# Patient Record
Sex: Female | Born: 1965 | Race: White | Hispanic: No | Marital: Single | Smoking: Former smoker
Health system: Southern US, Community
[De-identification: ages and names within clinical notes are randomized; demographics above are authoritative.]

## PROBLEM LIST (undated history)

## (undated) DIAGNOSIS — K219 Gastro-esophageal reflux disease without esophagitis: Secondary | ICD-10-CM

## (undated) DIAGNOSIS — R002 Palpitations: Secondary | ICD-10-CM

## (undated) DIAGNOSIS — J329 Chronic sinusitis, unspecified: Secondary | ICD-10-CM

## (undated) DIAGNOSIS — E785 Hyperlipidemia, unspecified: Secondary | ICD-10-CM

## (undated) DIAGNOSIS — U071 COVID-19: Secondary | ICD-10-CM

## (undated) HISTORY — DX: Chronic sinusitis, unspecified: J32.9

## (undated) HISTORY — DX: Hyperlipidemia, unspecified: E78.5

## (undated) HISTORY — PX: INDUCED ABORTION: SHX677

## (undated) HISTORY — DX: Palpitations: R00.2

## (undated) HISTORY — DX: Gastro-esophageal reflux disease without esophagitis: K21.9

## (undated) HISTORY — DX: COVID-19: U07.1

---

## 2002-07-14 ENCOUNTER — Other Ambulatory Visit: Admission: RE | Admit: 2002-07-14 | Discharge: 2002-07-14 | Payer: Self-pay | Admitting: Obstetrics & Gynecology

## 2003-04-22 ENCOUNTER — Encounter: Payer: Self-pay | Admitting: Obstetrics & Gynecology

## 2003-04-22 ENCOUNTER — Inpatient Hospital Stay (HOSPITAL_COMMUNITY): Admission: AD | Admit: 2003-04-22 | Discharge: 2003-04-22 | Payer: Self-pay | Admitting: Obstetrics & Gynecology

## 2003-10-08 ENCOUNTER — Inpatient Hospital Stay (HOSPITAL_COMMUNITY): Admission: AD | Admit: 2003-10-08 | Discharge: 2003-10-08 | Payer: Self-pay | Admitting: Obstetrics and Gynecology

## 2003-10-09 ENCOUNTER — Inpatient Hospital Stay (HOSPITAL_COMMUNITY): Admission: AD | Admit: 2003-10-09 | Discharge: 2003-10-09 | Payer: Self-pay | Admitting: Obstetrics and Gynecology

## 2003-10-10 ENCOUNTER — Inpatient Hospital Stay (HOSPITAL_COMMUNITY): Admission: AD | Admit: 2003-10-10 | Discharge: 2003-10-13 | Payer: Self-pay | Admitting: Obstetrics & Gynecology

## 2003-10-17 ENCOUNTER — Encounter: Admission: RE | Admit: 2003-10-17 | Discharge: 2003-11-16 | Payer: Self-pay | Admitting: Obstetrics & Gynecology

## 2003-11-08 ENCOUNTER — Other Ambulatory Visit: Admission: RE | Admit: 2003-11-08 | Discharge: 2003-11-08 | Payer: Self-pay | Admitting: Obstetrics & Gynecology

## 2004-12-23 ENCOUNTER — Other Ambulatory Visit: Admission: RE | Admit: 2004-12-23 | Discharge: 2004-12-23 | Payer: Self-pay | Admitting: Obstetrics and Gynecology

## 2006-01-12 ENCOUNTER — Other Ambulatory Visit: Admission: RE | Admit: 2006-01-12 | Discharge: 2006-01-12 | Payer: Self-pay | Admitting: Obstetrics & Gynecology

## 2009-09-18 ENCOUNTER — Emergency Department (HOSPITAL_COMMUNITY): Admission: EM | Admit: 2009-09-18 | Discharge: 2009-09-18 | Payer: Self-pay | Admitting: Emergency Medicine
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
## 2011-01-14 ENCOUNTER — Other Ambulatory Visit: Payer: Self-pay

## 2011-04-25 NOTE — Op Note (Signed)
NAME:  Tracy Bishop, Tracy Bishop                          ACCOUNT NO.:  [id number]   MEDICAL RECORD NO.:  [id number]                   PATIENT TYPE:  INP   LOCATION:  9102                                 FACILITY:  [facility]   PHYSICIAN:  Ilda Mori, M.D.                DATE OF BIRTH:  1966/03/21   DATE OF PROCEDURE:  10/11/2003  DATE OF DISCHARGE:                                 OPERATIVE REPORT   PREOPERATIVE DIAGNOSIS:  Non-reassuring fetal heart rate tracing; term  pregnancy in active labor.   POSTOPERATIVE DIAGNOSES:  Non-reassuring fetal heart rate tracing; term  pregnancy in active labor.   PROCEDURE:  Primary low transverse cesarean section.   SURGEON:  Ilda Mori, M.D.   ANESTHESIA:  Epidural.   ESTIMATED BLOOD LOSS:  500 cc.   FINDINGS:  Female infant, Apgar scores 8 and 9.  Birth weight 7 pounds 5  ounces.  Meconium-stained amniotic fluid.  Thin, single nuchal cord.  Normal  tubes and ovaries.  Arterial cord pH pending at the time of this dictation.   INDICATIONS:  This is 45 year old gravida 2, para 0, AB 1 who is admitted in  the morning of October 10, 2003 with dysfunctional latent phase labor.  Artificial rupture of membranes was performed, with clear fluid.  Cervix at  that point was 1 and 90% with the vertex at the 0 minus 1 station.  IV  Pitocin was begun and the patient had a prolonged latent phase, but finally  entered the active phase of labor at approximately midnight on October 10, 2003.  The patient began to experience moderate variables, with good  recovery and good variability of the baseline.  The impression was  intermittent cord compression, but no evidence of acidosis or hypoxia, and  the trial of labor was continued.  This observation was made a 1:15 a.m. on  the morning of October 11, 2003.  At 2:25 a.m. the cervix had progressed to  8 cm; however, the patient at that point was having persistent periodic  decelerations, which were late in onset and  occurring with every  contraction.  In view of no assurance of delivery within the next 60 mins,  the decision was made to proceed with cesarean section due to this  nonreassuring fetal heart rate tracing.   DESCRIPTION OF PROCEDURE:  The patient was brought to the operating room,  where adequate epidural anesthesia was obtained.  The abdomen was prepped  and draped in sterile fashion.  A low transverse incision was made and  carried down into the fascia.  The incision was extended transversely into  fascia.  The anterior rectus sheath was then dissected free and the  peritoneum was entered sharply and extended bluntly.  The lower uterine  segment was identified and incision was made in the serosa.  The bladder was  displaced inferiorly.  The incision was carried down to the  amniotic cavity,  where thick meconium was noted.  The incision was extended bluntly.  The  infant was delivered without difficulty.  There appeared to be no meconium  in the nasal, oropharynx or the level of the cord.  The infant was given to  the pediatrician.  A single nuchal cord was noted at that time, and the cord  was noted to be quite thin.  The cord bloods were obtained, including  arterial cord pH (which is pending at the time of this dictation).  The  placenta was delivered by manual extraction and the uterus was bluntly  curettaged.  The lower segment was closed with a single layer of running,  interlocking Vicryl suture.  The bladder plane was not reopposed.  The rectus muscle and peritoneum were closed with single running 3-0 Vicryl  suture.  The fascia was closed with running 0 Vicryl suture.  The skin was  closed with staples.  The patient tolerated the procedure well and left the  operating room in good condition.                                               Ilda Mori, M.D.    RK/MEDQ  D:  10/11/2003  T:  10/11/2003  Job:  [id number]

## 2011-04-25 NOTE — Discharge Summary (Signed)
NAME:  Tracy Bishop, Tracy Bishop                          ACCOUNT NO.:  [id number]   MEDICAL RECORD NO.:  [id number]                   PATIENT TYPE:  INP   LOCATION:  9102                                 FACILITY:  [facility]   PHYSICIAN:  Randye Lobo, M.D.                DATE OF BIRTH:  02/28/1966   DATE OF ADMISSION:  10/10/2003  DATE OF DISCHARGE:  10/13/2003                                 DISCHARGE SUMMARY   FINAL DIAGNOSES:  1. Intrauterine pregnancy at term.  2. Nonreassuring fetal heart rate tracing.  3. Term pregnancy in active labor.   PROCEDURE:  Primary low transverse cesarean section.   SURGEON:  Ilda Mori, M.D.   COMPLICATIONS:  None.   HISTORY:  This 45 year old G2, P0-0-1-0 was admitted on November 2 with  dysfunctional latent phase of labor.  The patient's prenatal course had been  complicated by advanced maternal age.  She did decline amniocentesis and a  quad screen.  The patient was also a smoker and unable to quit during her  pregnancy.  She did have a positive group B Strep culture performed in the  office.  At this point she was admitted for AROM and IV Pitocin.  The  patient's cervix on admission was 3+ cm dilated, 90% effaced, -1 station.  Around midnight on November 2 patient finally entered the active phase of  labor.  At this point some moderate variables were experienced, but with  recovering good variability.  The trial of labor was continued.  At 1:15 in  the morning Ilda Mori, M.D. got a call when the patient was about 8 cm  dilated.  At this point she was having persistent decelerations which were  late in onset and occurring with every contraction.  In view of this  nonreassuring fetal heart tracing the patient was taken to the operating  room for a cesarean section.  She was taken to the operating room on  October 11, 2003 by Ilda Mori, M.D. where a primary low transverse  cesarean section was performed with the delivery of a 7 pound 5 ounce  female  infant with Apgars of 8 and 9.  There was a thin single nuchal cord noted.  The delivery went without complication.  The patient's postoperative course  was benign without significant fevers.  She was felt ready for discharge on  postoperative day #2.  Was sent home on a regular diet.  Told to decrease  activities.  Told to continue prenatal vitamins.  Was given a prescription  for Percocet one to two q.4h. as needed for pain.  Told she could use  ibuprofen q.6h. as needed for pain.  Was to follow up in the office in four  weeks.   DISCHARGE LABORATORIES:  The patient had a hemoglobin of 12.4 and a white  blood cell count of 15.9.     Leilani Able, P.A.-C.  Randye Lobo, M.D.    MB/MEDQ  D:  11/13/2003  T:  11/13/2003  Job:  [id number]

## 2016-01-24 ENCOUNTER — Ambulatory Visit
Admission: RE | Admit: 2016-01-24 | Discharge: 2016-01-24 | Disposition: A | Discharge status: Home / Self Care | Payer: BLUE CROSS/BLUE SHIELD | Source: Ambulatory Visit | Attending: *Deleted | Admitting: *Deleted

## 2016-01-24 ENCOUNTER — Other Ambulatory Visit: Payer: Self-pay | Admitting: *Deleted

## 2016-01-24 DIAGNOSIS — M542 Cervicalgia: Secondary | ICD-10-CM

## 2016-03-27 DIAGNOSIS — M47812 Spondylosis without myelopathy or radiculopathy, cervical region: Secondary | ICD-10-CM | POA: Insufficient documentation

## 2017-01-29 IMAGING — CR DG CERVICAL SPINE 2 OR 3 VIEWS
2 series · 2 of 2 positions shown · non-contrast
Comparison: None.

CLINICAL DATA: Chronic cervicalgia

EXAM:
CERVICAL SPINE - 2-3 VIEW

[w cervical spine lat]
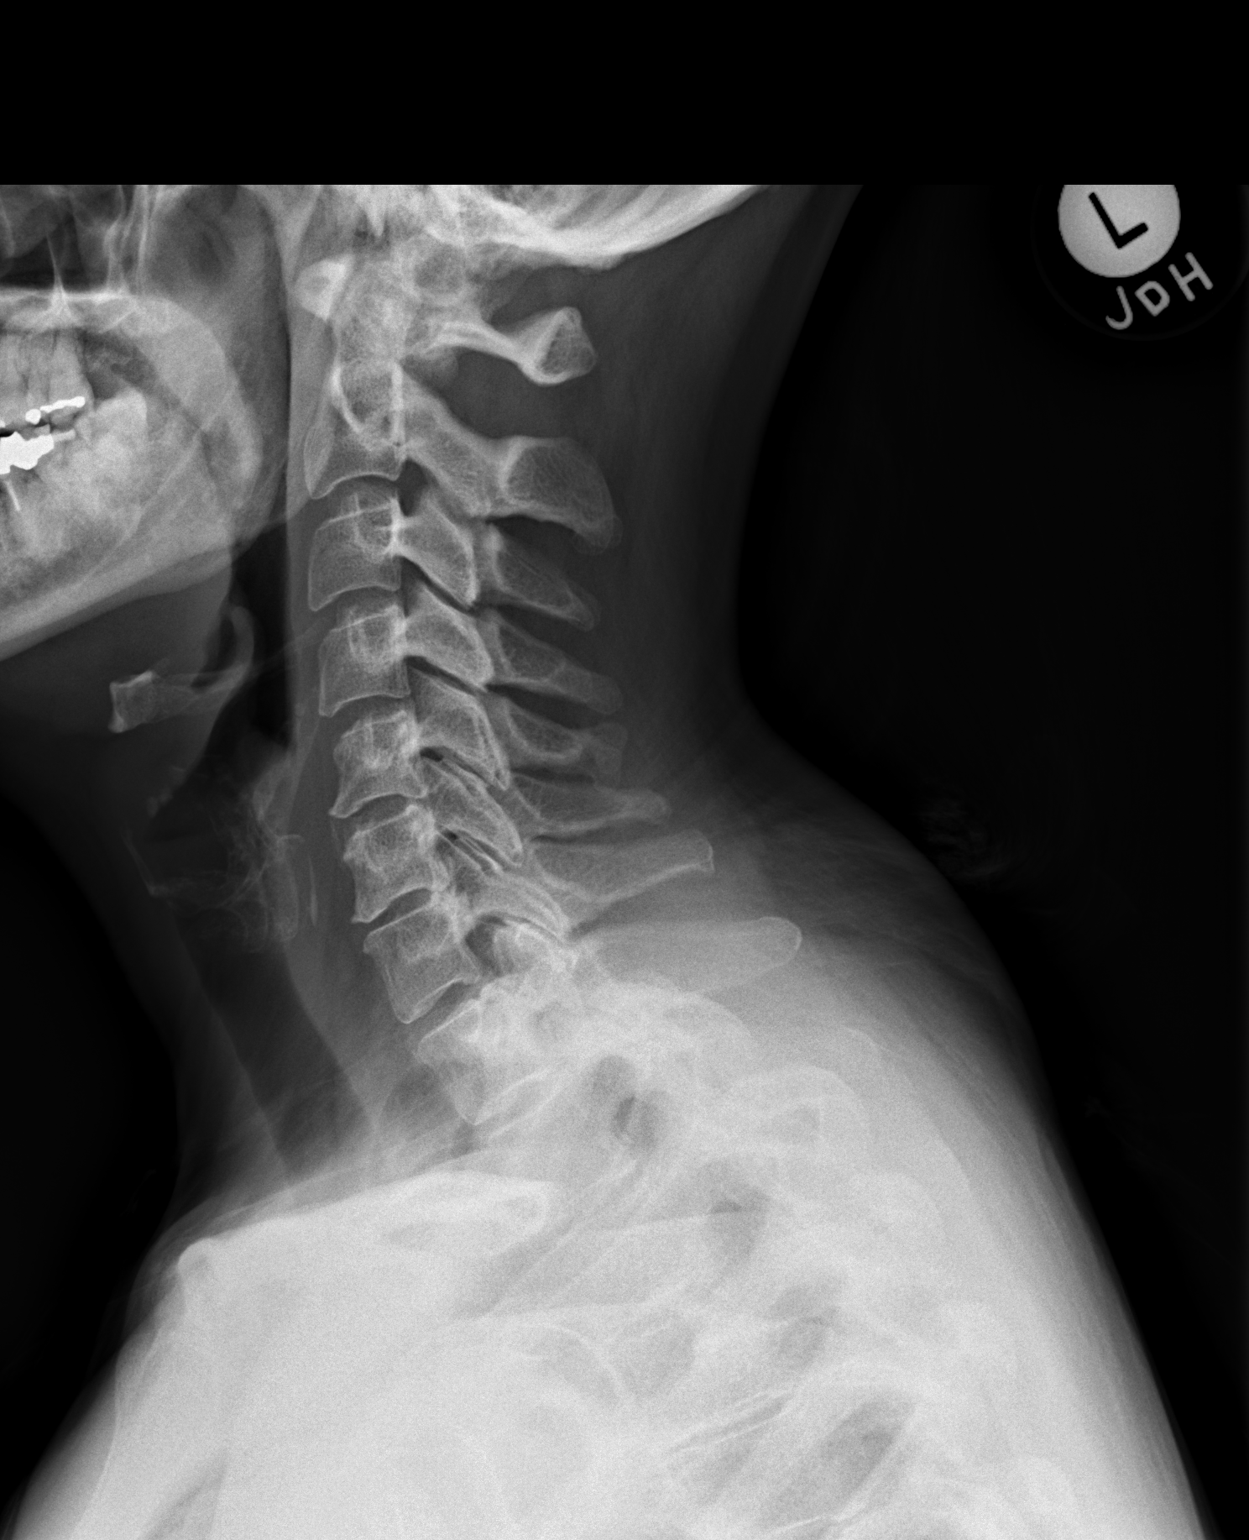

[w cervical spine ap]
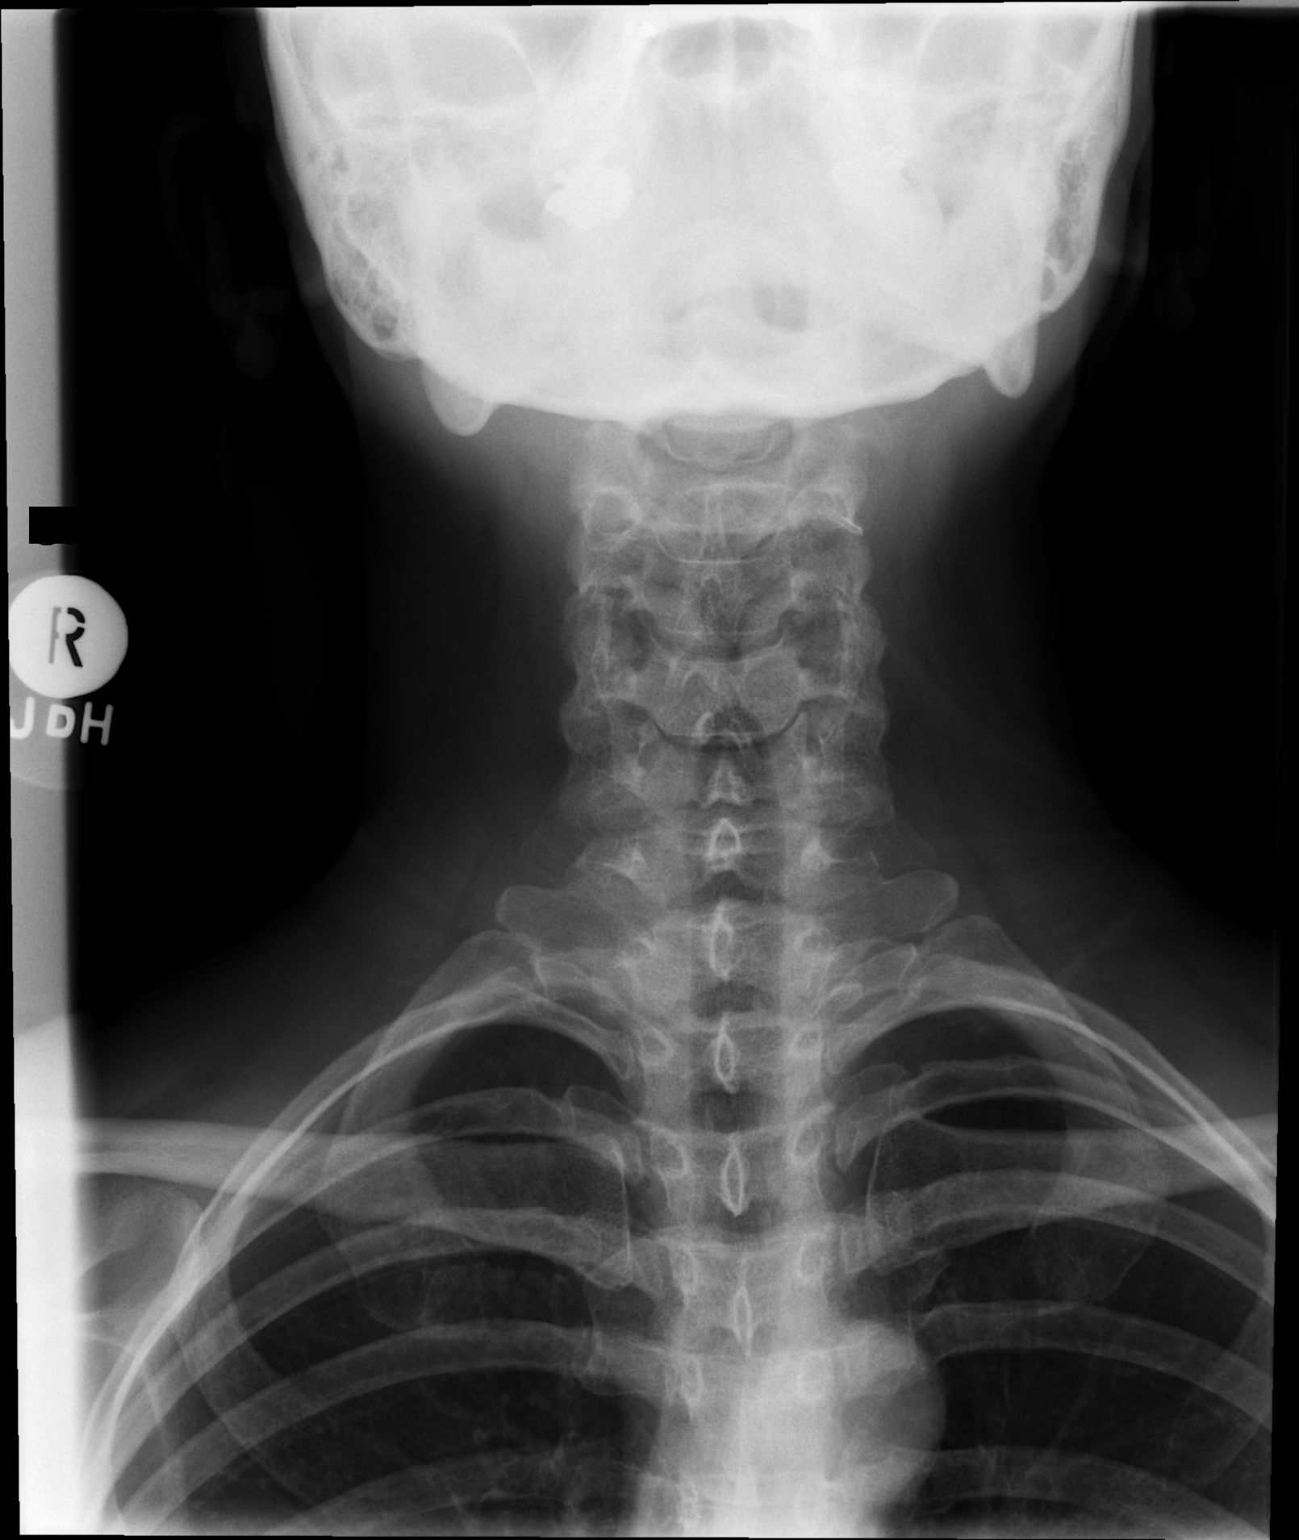

[2 of 2 positions shown; findings below may reference images not displayed]

FINDINGS: Upright frontal and upright lateral views obtained. There is no
fracture or spondylolisthesis. Prevertebral soft tissues and
predental space regions are normal. There is moderate disc space
narrowing at C6-7. There is slightly milder narrowing of the disc at
C5-6. There are anterior osteophytes at C5 and C6. No erosive
change.
IMPRESSION: Osteoarthritic change at C5-6 and C6-7. No fracture or
spondylolisthesis.

## 2018-05-27 ENCOUNTER — Ambulatory Visit: Payer: Self-pay | Admitting: Allergy

## 2019-02-08 ENCOUNTER — Encounter: Payer: Self-pay | Admitting: Gastroenterology

## 2019-02-22 ENCOUNTER — Telehealth: Payer: Self-pay | Admitting: *Deleted

## 2019-02-22 NOTE — Telephone Encounter (Signed)
Covid-19 travel screening questions  Have you traveled in the last 14 days? no If yes where?  Do you now or have you had a fever in the last 14 days? no  Do you have any respiratory symptoms of shortness of breath or cough now or in the last 14 days? No new cough- pt has dry, non productive cough- pt states she has allergies and has this cough every allergy season. No new symptoms  Do you have a medical history of Congestive Heart Failure? no  Do you have a medical history of lung disease? no  Do you have any family members or close contacts with diagnosed or suspected Covid-19? no

## 2019-02-23 ENCOUNTER — Other Ambulatory Visit: Payer: Self-pay

## 2019-02-23 ENCOUNTER — Telehealth: Payer: Self-pay | Admitting: *Deleted

## 2019-02-23 NOTE — Telephone Encounter (Signed)
Patient needs cardiac clearance for Procedure. Patient cancelled cardiology appointment earlier this week and had rescheduled with [facility] on 03/22/2019.Dr Chales Abrahams business card given and will call back once work up is complete.

## 2019-03-09 ENCOUNTER — Encounter: Payer: BLUE CROSS/BLUE SHIELD | Admitting: Gastroenterology

## 2021-03-26 ENCOUNTER — Other Ambulatory Visit (HOSPITAL_COMMUNITY): Payer: Self-pay

## 2021-03-27 ENCOUNTER — Other Ambulatory Visit (HOSPITAL_COMMUNITY): Payer: Self-pay

## 2021-03-27 MED ORDER — CLIMARA PRO 0.045-0.015 MG/DAY TD PTWK
MEDICATED_PATCH | TRANSDERMAL | 11 refills | Status: DC
  Filled 2021-03-27 (×2): qty 4, 28d supply, fill #0

## 2021-03-27 MED ORDER — CLIMARA PRO 0.045-0.015 MG/DAY TD PTWK
MEDICATED_PATCH | TRANSDERMAL | 11 refills | Status: DC
  Filled 2021-03-27: qty 4, 28d supply, fill #0

## 2021-03-28 ENCOUNTER — Other Ambulatory Visit (HOSPITAL_COMMUNITY): Payer: Self-pay

## 2021-03-28 MED ORDER — PROGESTERONE MICRONIZED 100 MG PO CAPS
ORAL_CAPSULE | ORAL | 11 refills | Status: DC
  Filled 2021-03-28: qty 30, 30d supply, fill #0
  Filled 2021-05-08: qty 30, 30d supply, fill #1
  Filled 2021-06-12: qty 30, 30d supply, fill #2
  Filled 2021-07-08: qty 30, 30d supply, fill #3
  Filled 2021-08-07: qty 30, 30d supply, fill #4
  Filled 2021-09-02: qty 30, 30d supply, fill #5
  Filled 2021-10-29: qty 30, 30d supply, fill #6
  Filled 2021-12-03: qty 30, 30d supply, fill #7
  Filled 2022-02-03: qty 30, 30d supply, fill #8
  Filled 2022-03-18: qty 30, 30d supply, fill #9
  Filled 2022-03-25: qty 30, 30d supply, fill #10

## 2021-03-28 MED ORDER — ESTRADIOL 0.05 MG/24HR TD PTWK
MEDICATED_PATCH | TRANSDERMAL | 11 refills | Status: DC
  Filled 2021-03-28 (×3): qty 4, 28d supply, fill #0
  Filled 2021-11-05: qty 4, 28d supply, fill #1

## 2021-03-29 ENCOUNTER — Other Ambulatory Visit (HOSPITAL_COMMUNITY): Payer: Self-pay

## 2021-04-04 ENCOUNTER — Other Ambulatory Visit (HOSPITAL_COMMUNITY): Payer: Self-pay

## 2021-04-04 MED ORDER — ESTRADIOL 0.05 MG/24HR TD PTTW
MEDICATED_PATCH | TRANSDERMAL | 11 refills | Status: DC
  Filled 2021-04-04 – 2021-04-16 (×2): qty 8, 28d supply, fill #0
  Filled 2021-05-09: qty 8, 28d supply, fill #1
  Filled 2021-06-12: qty 8, 28d supply, fill #2
  Filled 2021-07-08: qty 8, 28d supply, fill #3
  Filled 2021-08-07: qty 8, 28d supply, fill #4
  Filled 2021-09-02: qty 8, 28d supply, fill #5
  Filled 2021-10-07: qty 8, 28d supply, fill #6
  Filled 2021-11-08: qty 8, 28d supply, fill #7
  Filled 2021-11-28: qty 8, 28d supply, fill #8

## 2021-04-11 ENCOUNTER — Other Ambulatory Visit (HOSPITAL_COMMUNITY): Payer: Self-pay

## 2021-04-16 ENCOUNTER — Other Ambulatory Visit (HOSPITAL_COMMUNITY): Payer: Self-pay

## 2021-05-08 ENCOUNTER — Other Ambulatory Visit (HOSPITAL_COMMUNITY): Payer: Self-pay

## 2021-05-09 ENCOUNTER — Other Ambulatory Visit (HOSPITAL_COMMUNITY): Payer: Self-pay

## 2021-06-12 ENCOUNTER — Other Ambulatory Visit (HOSPITAL_COMMUNITY): Payer: Self-pay

## 2021-07-08 ENCOUNTER — Other Ambulatory Visit (HOSPITAL_COMMUNITY): Payer: Self-pay

## 2021-07-10 ENCOUNTER — Other Ambulatory Visit (HOSPITAL_COMMUNITY): Payer: Self-pay

## 2021-08-07 ENCOUNTER — Other Ambulatory Visit (HOSPITAL_COMMUNITY): Payer: Self-pay

## 2021-08-09 ENCOUNTER — Other Ambulatory Visit (HOSPITAL_COMMUNITY): Payer: Self-pay

## 2021-09-02 ENCOUNTER — Other Ambulatory Visit (HOSPITAL_COMMUNITY): Payer: Self-pay

## 2021-10-07 ENCOUNTER — Other Ambulatory Visit (HOSPITAL_COMMUNITY): Payer: Self-pay

## 2021-10-29 ENCOUNTER — Other Ambulatory Visit (HOSPITAL_COMMUNITY): Payer: Self-pay

## 2021-11-05 ENCOUNTER — Other Ambulatory Visit (HOSPITAL_COMMUNITY): Payer: Self-pay

## 2021-11-06 ENCOUNTER — Other Ambulatory Visit (HOSPITAL_COMMUNITY): Payer: Self-pay

## 2021-11-08 ENCOUNTER — Other Ambulatory Visit (HOSPITAL_COMMUNITY): Payer: Self-pay

## 2021-11-12 ENCOUNTER — Other Ambulatory Visit: Payer: Self-pay

## 2021-11-12 ENCOUNTER — Other Ambulatory Visit (HOSPITAL_COMMUNITY): Payer: Self-pay

## 2021-11-12 ENCOUNTER — Ambulatory Visit (AMBULATORY_SURGERY_CENTER): Payer: Commercial Managed Care - PPO

## 2021-11-12 VITALS — Ht 65.0 in | Wt 161.0 lb

## 2021-11-12 DIAGNOSIS — Z1211 Encounter for screening for malignant neoplasm of colon: Secondary | ICD-10-CM

## 2021-11-12 MED ORDER — NA SULFATE-K SULFATE-MG SULF 17.5-3.13-1.6 GM/177ML PO SOLN
1.0000 | Freq: Once | ORAL | 0 refills | Status: AC
  Filled 2021-11-12: qty 354, 1d supply, fill #0

## 2021-11-12 NOTE — Progress Notes (Signed)
  Patient's pre-visit was done today over the phone with the patient   Name,DOB and address verified.   Patient denies any allergies to Eggs and Soy.  Patient denies any problems with anesthesia/sedation. Patient denies taking diet pills or blood thinners.  Denies atrial flutter or atrial fib Denies chronic constipation No home Oxygen.   Packet of Prep instructions mailed to patient including a copy of a consent form-pt is aware.  Patient understands to call us back with any questions or concerns.  Patient is aware of our care-partner policy and Covid-19 safety protocol.   The patient is COVID-19 vaccinated.    

## 2021-11-20 ENCOUNTER — Encounter: Payer: Self-pay | Admitting: Gastroenterology

## 2021-11-21 ENCOUNTER — Other Ambulatory Visit (HOSPITAL_COMMUNITY): Payer: Self-pay

## 2021-11-26 ENCOUNTER — Encounter: Payer: Commercial Managed Care - PPO | Admitting: Gastroenterology

## 2021-11-26 ENCOUNTER — Telehealth: Payer: Self-pay | Admitting: Gastroenterology

## 2021-11-26 NOTE — Telephone Encounter (Signed)
Good Morning Dr. Chales Abrahams   I called patient at 8:45am to see if patient was coming for her procedure she stated she called last week to cancel she said she had not heard from the insurance and also  had a party to attend and would not be able to fast.  She  Will call and reschedule appointment.

## 2021-11-26 NOTE — Telephone Encounter (Signed)
Thanks for letting me know RG 

## 2021-11-28 ENCOUNTER — Other Ambulatory Visit (HOSPITAL_COMMUNITY): Payer: Self-pay

## 2021-12-03 ENCOUNTER — Other Ambulatory Visit (HOSPITAL_COMMUNITY): Payer: Self-pay

## 2021-12-12 ENCOUNTER — Other Ambulatory Visit (HOSPITAL_COMMUNITY): Payer: Self-pay

## 2021-12-13 ENCOUNTER — Other Ambulatory Visit (HOSPITAL_BASED_OUTPATIENT_CLINIC_OR_DEPARTMENT_OTHER): Payer: Self-pay

## 2021-12-13 MED ORDER — ONDANSETRON HCL 4 MG PO TABS
ORAL_TABLET | ORAL | 0 refills | Status: DC
  Filled 2021-12-13: qty 30, 10d supply, fill #0

## 2021-12-17 ENCOUNTER — Other Ambulatory Visit (HOSPITAL_COMMUNITY): Payer: Self-pay

## 2021-12-17 MED ORDER — ALPRAZOLAM 0.25 MG PO TABS
ORAL_TABLET | ORAL | 0 refills | Status: DC
  Filled 2021-12-17: qty 30, 30d supply, fill #0

## 2021-12-20 ENCOUNTER — Other Ambulatory Visit (HOSPITAL_COMMUNITY): Payer: Self-pay

## 2021-12-20 MED ORDER — FLUCONAZOLE 150 MG PO TABS
ORAL_TABLET | ORAL | 0 refills | Status: DC
  Filled 2021-12-20: qty 2, 6d supply, fill #0

## 2021-12-26 ENCOUNTER — Other Ambulatory Visit (HOSPITAL_COMMUNITY): Payer: Self-pay

## 2021-12-26 MED ORDER — BUPROPION HCL ER (XL) 150 MG PO TB24
ORAL_TABLET | ORAL | 11 refills | Status: DC
  Filled 2021-12-26: qty 173, 90d supply, fill #0

## 2022-01-07 ENCOUNTER — Other Ambulatory Visit (HOSPITAL_COMMUNITY): Payer: Self-pay

## 2022-01-08 ENCOUNTER — Other Ambulatory Visit (HOSPITAL_COMMUNITY): Payer: Self-pay

## 2022-01-09 ENCOUNTER — Other Ambulatory Visit (HOSPITAL_COMMUNITY): Payer: Self-pay

## 2022-01-09 MED ORDER — AMOXICILLIN-POT CLAVULANATE 875-125 MG PO TABS
ORAL_TABLET | ORAL | 0 refills | Status: DC
  Filled 2022-01-09: qty 28, 14d supply, fill #0

## 2022-01-09 MED ORDER — AZELASTINE HCL 0.05 % OP SOLN
OPHTHALMIC | 2 refills | Status: AC
  Filled 2022-01-09: qty 6, 30d supply, fill #0

## 2022-01-09 MED ORDER — FLUCONAZOLE 150 MG PO TABS
ORAL_TABLET | ORAL | 0 refills | Status: DC
  Filled 2022-01-09: qty 2, 4d supply, fill #0

## 2022-01-09 MED ORDER — AZELASTINE HCL 0.1 % NA SOLN
NASAL | 0 refills | Status: AC
  Filled 2022-01-09: qty 30, 25d supply, fill #0

## 2022-02-03 ENCOUNTER — Other Ambulatory Visit (HOSPITAL_COMMUNITY): Payer: Self-pay

## 2022-02-03 MED ORDER — ESTRADIOL 0.05 MG/24HR TD PTTW
MEDICATED_PATCH | TRANSDERMAL | 1 refills | Status: DC
  Filled 2022-02-03: qty 8, 28d supply, fill #0
  Filled 2022-03-05: qty 8, 28d supply, fill #1

## 2022-02-04 ENCOUNTER — Other Ambulatory Visit (HOSPITAL_COMMUNITY): Payer: Self-pay

## 2022-02-11 ENCOUNTER — Other Ambulatory Visit (HOSPITAL_COMMUNITY): Payer: Self-pay

## 2022-02-11 MED ORDER — ALPRAZOLAM 0.25 MG PO TABS
ORAL_TABLET | ORAL | 0 refills | Status: DC
  Filled 2022-02-11: qty 30, 30d supply, fill #0

## 2022-02-11 MED ORDER — ONDANSETRON HCL 4 MG PO TABS
ORAL_TABLET | ORAL | 0 refills | Status: AC
  Filled 2022-02-11: qty 30, 10d supply, fill #0

## 2022-02-11 MED ORDER — OMEPRAZOLE 20 MG PO CPDR
DELAYED_RELEASE_CAPSULE | ORAL | 3 refills | Status: DC
  Filled 2022-02-11: qty 90, 90d supply, fill #0
  Filled 2022-05-14: qty 90, 90d supply, fill #1
  Filled 2022-08-12: qty 90, 90d supply, fill #2
  Filled 2022-10-20: qty 90, 90d supply, fill #3

## 2022-02-11 MED ORDER — SUMATRIPTAN SUCCINATE 50 MG PO TABS
ORAL_TABLET | ORAL | 2 refills | Status: AC
  Filled 2022-02-11: qty 10, 30d supply, fill #0

## 2022-02-13 ENCOUNTER — Telehealth: Payer: Self-pay

## 2022-02-13 NOTE — Telephone Encounter (Signed)
NOTES SCANNED TO REFERRAL 

## 2022-02-25 NOTE — Progress Notes (Signed)
?Cardiology Office Note:   ? ?Date:  02/26/2022  ? ?ID:  Tracy Bishop, DOB 04-Jun-1966, MRN [id number] ? ?PCP:  [name], MD  ? ?[facility] ?Cardiologist:  Alverda Skeans, MD ?Referring MD: [name], MD  ? ?Chief Complaint/Reason for Referral: Palpitations ? ?ASSESSMENT:   ? ?1. Palpitations   ? ? ?PLAN:   ? ?In order of problems listed above: ?1.  We will obtain echocardiogram and monitor.  TSH was drawn by her PCP.  The fact that she is able to exercise without angina is reassuring.  I have asked her to stop aspirin since she has no indication for right now.  Primary prevention aspirin is no longer pursued.  She is to get a calcium score soon and if this demonstrates coronary calcium then aspirin monotherapy should be resumed and atorvastatin 40 mg should be considered with a goal LDL of less than 70.  We will keep follow-up open-ended depending on the results of this testing.   ? ?     ? ?   ? ?Dispo:  Return if symptoms worsen or fail to improve.  ? ?  ? ?Medication Adjustments/Labs and Tests Ordered: ?Current medicines are reviewed at length with the patient today.  Concerns regarding medicines are outlined above.  ? ?Tests Ordered: ?Orders Placed This Encounter  ?Procedures  ? LONG TERM MONITOR (3-14 DAYS)  ? EKG 12-Lead  ? ECHOCARDIOGRAM COMPLETE  ? ? ?Medication Changes: ?No orders of the defined types were placed in this encounter. ? ? ?History of Present Illness:   ? ?FOCUSED PROBLEM LIST:   ?1.  GERD ?2.  Former smoking; continues to vape ?3.  Hyperlipidemia ?4.  COVID-19 infection November 2022 ? ?The patient is a 56 y.o. female with the indicated medical history here for for palpitations.  The patient had seen her primary care provider recently.  She was marked in the 1 year anniversary of her son having contracted COVID and having a very long hospital course complicated by a brain abscess.  She reported episodic heart palpitations for a few years.  She denied any chest  discomfort or shortness of breath at that appointment.  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  She has started to exercise over the last few weeks after taking quite a break from this over COVID.  She gets some shortness of breath with exercising but denies any exertional angina.  She does get chest tightness when she gets stressed.  She denies any presyncope or syncope.  She has required no hospitalizations or emergency room visits.  She has had no bad bleeding or bruising episodes. ? ?  ? ?Current Medications: ?Current Meds  ?Medication Sig  ? ALPRAZolam (XANAX) 0.25 MG tablet TAKE 1/2 TO 1 TABLET BY MOUTH DAILY AS NEEDED FOR SLEEP  ? ALPRAZOLAM PO   ? azelastine (ASTELIN) 0.1 % nasal spray Place two sprays into both nostrils 2 (two) times daily.  ? azelastine (OPTIVAR) 0.05 % ophthalmic solution Place one drop into both eyes 2 (two) times daily.  ? benzonatate (TESSALON) 100 MG capsule as needed.  ? BIOTIN PO biotin  ? diphenhydrAMINE (SOMINEX) 25 MG tablet Take by mouth.  ? estradiol (VIVELLE-DOT) 0.05 MG/24HR patch Apply 1 patch twice a week by transdermal route.  ? fluconazole (DIFLUCAN) 150 MG tablet Take 1 tablet once for yeast infection; may repeat once in 3 days if needed  ? fluticasone (FLONASE) 50 MCG/ACT nasal spray fluticasone  propionate 50 mcg/actuation nasal spray,suspension  ? guaiFENesin (MUCINEX) 600 MG 12 hr tablet Take by mouth.  ? Loratadine (CLARITIN PO) Claritin  ? montelukast (SINGULAIR) 10 MG tablet montelukast 10 mg tablet  ? Multiple Vitamins-Minerals (ZINC PO) zinc  ? omeprazole (PRILOSEC) 20 MG capsule Take one capsule (20 mg dose) by mouth daily.  ? OMEPRAZOLE PO   ? ondansetron (ZOFRAN) 4 MG tablet Take one tablet by mouth every 8 (eight) hours as needed for Nausea.  ? progesterone (PROMETRIUM) 100 MG capsule Take 1 capsule by mouth daily  ? Pyridoxine HCl (B-6 PO) Take by mouth.  ? SUMAtriptan (IMITREX) 50 MG tablet Take one tablet by mouth once as needed for  Migraine for up to 1 dose.  ? tretinoin (RETIN-A) 0.025 % cream 1 application to affected area in the evening to face  ? vitamin B-12 (CYANOCOBALAMIN) 100 MCG tablet Place under the tongue.  ? VITAMIN D PO Vitamin D  ? VITAMIN E PO vitamin E  ? VITAMIN K PO Take by mouth.  ? zinc gluconate 50 MG tablet Take by mouth daily.  ? [DISCONTINUED] amoxicillin-clavulanate (AUGMENTIN) 875-125 MG tablet Take one tablet by mouth 2 (two) times daily for 14 days.  ? [DISCONTINUED] ASPIRIN PO aspirin 81 mg tablet ? Take by oral route.  ? [DISCONTINUED] buPROPion (WELLBUTRIN XL) 150 MG 24 hr tablet Take 1 tablet by mouth daily. If tolerate well after 1 week, can increase to 2 tablets daily  ? [DISCONTINUED] estradiol (VIVELLE-DOT) 0.05 MG/24HR patch Apply 1 patch topically twice a week  ?  ? ?Allergies:    ?Levofloxacin and Penicillins  ? ?Social History:   ?Social History  ? ?Tobacco Use  ? Smoking status: Former  ?  Types: Cigarettes  ? Smokeless tobacco: Never  ?Vaping Use  ? Vaping Use: Some days  ?Substance Use Topics  ? Alcohol use: Yes  ?  Comment: 2-3 times aweek  ? Drug use: Not Currently  ?  ? ?Family Hx: ?Family History  ?Problem Relation Age of Onset  ? Colon cancer Neg Hx   ? Colon polyps Neg Hx   ? Esophageal cancer Neg Hx   ? Stomach cancer Neg Hx   ? Rectal cancer Neg Hx   ?  ? ?Review of Systems:   ?Please see the history of present illness.    ?All other systems reviewed and are negative. ?  ? ? ?EKGs/Labs/Other Test Reviewed:   ? ?EKG: Today's EKG demonstrates sinus rhythm ? ?Prior CV studies: ? ?DSE 2013 ?ECG REST   ?The baseline ECG displays normal sinus rhythm.   ?-   ?ECG STRESS   ?No diagnostic ST segment changes were seen. Negative stress ECG for inducible    ?ischemia at target heart rate. ECG changes, blood pressure and heart rate    ?responses during stress test are shown in the Cardiology Scan portion of this    ?report in the EPIC. Isolated PVC, sinus pause.   ?-   ?REST ECHO   ?Normal left  ventricular function at rest. The estimated LV ejection fraction    ?is 55-60% . There were no segmental wall motion abnormalities at rest.   ?-   ?STRESS ECHO   ?Normal left ventricular function with stress. There were no segmental wall    ?motion abnormalities post exercise. The estimated LV ejection fraction is    ?60-65% . Negative exercise echocardiography for inducible ischemia at target    ?heart rate. ? ?Imaging studies that I  have independently reviewed today: Stress echo, cervical spine CT ? ?Recent Labs: ?No results found for requested labs within last 8760 hours.  ? ?Recent Lipid Panel ?No results found for: CHOL, TRIG, HDL, LDLCALC, LDLDIRECT ? ?Risk Assessment/Calculations:   ? ?  ?    ? ?Physical Exam:   ? ?VS:  BP 122/72   Pulse 81   Ht 5\' 5"  (1.651 m)   Wt 166 lb (75.3 kg)   SpO2 98%   BMI 27.62 kg/m?    ?Wt Readings from Last 3 Encounters:  ?02/26/22 166 lb (75.3 kg)  ?11/12/21 161 lb (73 kg)  ?  ?GENERAL:  No apparent distress, AOx3 ?HEENT:  No carotid bruits, +2 carotid impulses, no scleral icterus ?CAR: RRR no murmurs, gallops, rubs, or thrills ?RES:  Clear to auscultation bilaterally ?ABD:  Soft, nontender, nondistended, positive bowel sounds x 4 ?VASC:  +2 radial pulses, +2 carotid pulses, palpable pedal pulses ?NEURO:  CN 2-12 grossly intact; motor and sensory grossly intact ?PSYCH:  No active depression or anxiety ?EXT:  No edema, ecchymosis, or cyanosis ? ?Signed, ?14/06/22, MD  ?02/26/2022 1:35 PM    ?[facility] ?[address] [place], [place], [place]  [place] ?Phone: [phone]; Fax: [phone]  ? ?Note:  This document was prepared using Dragon voice recognition software and may include unintentional dictation errors. ?

## 2022-02-26 ENCOUNTER — Ambulatory Visit: Payer: BC Managed Care – PPO | Admitting: Internal Medicine

## 2022-02-26 ENCOUNTER — Encounter: Payer: Self-pay | Admitting: Internal Medicine

## 2022-02-26 ENCOUNTER — Ambulatory Visit: Payer: BC Managed Care – PPO

## 2022-02-26 ENCOUNTER — Other Ambulatory Visit: Payer: Self-pay

## 2022-02-26 VITALS — BP 122/72 | HR 81 | Ht 65.0 in | Wt 166.0 lb

## 2022-02-26 DIAGNOSIS — R002 Palpitations: Secondary | ICD-10-CM

## 2022-02-26 NOTE — Patient Instructions (Addendum)
Medication Instructions:  ?Your physician has recommended you make the following change in your medication:  ?1.) stop aspirin ? ?*If you need a refill on your cardiac medications before your next appointment, please call your pharmacy* ? ? ?Lab Work: ?none ? ? ?Testing/Procedures: ?Your physician has requested that you have an echocardiogram. Echocardiography is a painless test that uses sound waves to create images of your heart. It provides your doctor with information about the size and shape of your heart and how well your heart?s chambers and valves are working. This procedure takes approximately one hour. There are no restrictions for this procedure. ? ?14 day Zio Heart Montior - to be applied today. ? ?Follow-Up: ?As needed ? ?Other Instructions ?  ?

## 2022-02-26 NOTE — Progress Notes (Unsigned)
Applied a 14 day Zio XT monitor to patient in the office ?

## 2022-03-05 ENCOUNTER — Other Ambulatory Visit (HOSPITAL_COMMUNITY): Payer: Self-pay

## 2022-03-10 ENCOUNTER — Other Ambulatory Visit (HOSPITAL_BASED_OUTPATIENT_CLINIC_OR_DEPARTMENT_OTHER): Payer: Self-pay

## 2022-03-10 MED ORDER — NYSTATIN 100000 UNIT/GM EX CREA
TOPICAL_CREAM | Freq: Two times a day (BID) | CUTANEOUS | 0 refills | Status: DC
  Filled 2022-03-10: qty 30, 7d supply, fill #0
  Filled 2022-03-11: qty 30, 10d supply, fill #0

## 2022-03-11 ENCOUNTER — Encounter: Payer: Self-pay | Admitting: *Deleted

## 2022-03-11 ENCOUNTER — Other Ambulatory Visit (HOSPITAL_BASED_OUTPATIENT_CLINIC_OR_DEPARTMENT_OTHER): Payer: Self-pay

## 2022-03-11 ENCOUNTER — Other Ambulatory Visit (HOSPITAL_COMMUNITY): Payer: Self-pay

## 2022-03-11 NOTE — Progress Notes (Signed)
Patient ID: Tracy Bishop, female   DOB: 15-Nov-1966, 56 y.o.   MRN: [id number] ?Patients ZIO XT monitor fell off in less than 24 hours.  [employer] was contacted to cancel charges.  Patient not interested in redoing monitor at this time.  She has an event coming up and did not want a sore on her upper chest, (monitor was irritating to her skin).   ?Was asked to please leave order in the system.  She may try again later this year if symptoms persist. ?

## 2022-03-14 ENCOUNTER — Other Ambulatory Visit (HOSPITAL_BASED_OUTPATIENT_CLINIC_OR_DEPARTMENT_OTHER): Payer: Self-pay

## 2022-03-14 MED ORDER — NYSTATIN 100000 UNIT/GM EX POWD
CUTANEOUS | 0 refills | Status: DC
  Filled 2022-03-14: qty 60, 10d supply, fill #0

## 2022-03-18 ENCOUNTER — Other Ambulatory Visit (HOSPITAL_COMMUNITY): Payer: Self-pay

## 2022-03-18 ENCOUNTER — Other Ambulatory Visit (HOSPITAL_BASED_OUTPATIENT_CLINIC_OR_DEPARTMENT_OTHER): Payer: Self-pay

## 2022-03-18 MED ORDER — FLUCONAZOLE 100 MG PO TABS
ORAL_TABLET | ORAL | 0 refills | Status: AC
  Filled 2022-03-18: qty 11, 10d supply, fill #0

## 2022-03-19 ENCOUNTER — Other Ambulatory Visit (HOSPITAL_COMMUNITY): Payer: BC Managed Care – PPO

## 2022-03-25 ENCOUNTER — Other Ambulatory Visit (HOSPITAL_COMMUNITY): Payer: Self-pay

## 2022-03-25 MED ORDER — ESTRADIOL 0.05 MG/24HR TD PTTW
MEDICATED_PATCH | TRANSDERMAL | 11 refills | Status: AC
  Filled 2022-03-25: qty 8, 28d supply, fill #0
  Filled 2023-03-18: qty 8, 28d supply, fill #1

## 2022-03-28 ENCOUNTER — Other Ambulatory Visit (HOSPITAL_BASED_OUTPATIENT_CLINIC_OR_DEPARTMENT_OTHER): Payer: Self-pay

## 2022-04-02 ENCOUNTER — Other Ambulatory Visit (HOSPITAL_COMMUNITY): Payer: Self-pay

## 2022-04-02 MED ORDER — ESTRADIOL 0.05 MG/24HR TD PTTW
MEDICATED_PATCH | TRANSDERMAL | 3 refills | Status: DC
  Filled 2022-04-02 – 2022-04-24 (×2): qty 24, 84d supply, fill #0
  Filled 2022-07-22: qty 24, 84d supply, fill #1
  Filled 2022-10-16: qty 24, 84d supply, fill #2
  Filled 2023-01-08: qty 8, 28d supply, fill #3
  Filled 2023-01-08: qty 16, 56d supply, fill #3
  Filled 2023-01-08 (×2): qty 24, 84d supply, fill #3

## 2022-04-02 MED ORDER — PROGESTERONE MICRONIZED 100 MG PO CAPS
ORAL_CAPSULE | ORAL | 3 refills | Status: DC
  Filled 2022-04-02 – 2022-04-24 (×2): qty 90, 90d supply, fill #0
  Filled 2022-07-22: qty 90, 90d supply, fill #1
  Filled 2022-11-18: qty 90, 90d supply, fill #2
  Filled 2023-03-06: qty 90, 90d supply, fill #3

## 2022-04-17 ENCOUNTER — Other Ambulatory Visit (HOSPITAL_COMMUNITY): Payer: Self-pay

## 2022-04-17 MED ORDER — CEFDINIR 300 MG PO CAPS
ORAL_CAPSULE | ORAL | 0 refills | Status: DC
  Filled 2022-04-17: qty 20, 10d supply, fill #0

## 2022-04-18 ENCOUNTER — Other Ambulatory Visit (HOSPITAL_COMMUNITY): Payer: Self-pay

## 2022-04-18 MED ORDER — NEOMYCIN-POLYMYXIN-DEXAMETH 3.5-10000-0.1 OP SUSP
OPHTHALMIC | 0 refills | Status: DC
  Filled 2022-04-18: qty 5, 12d supply, fill #0

## 2022-04-18 MED ORDER — FLUCONAZOLE 150 MG PO TABS
ORAL_TABLET | ORAL | 0 refills | Status: DC
  Filled 2022-04-18: qty 2, 3d supply, fill #0

## 2022-04-24 ENCOUNTER — Other Ambulatory Visit (HOSPITAL_COMMUNITY): Payer: Self-pay

## 2022-04-25 ENCOUNTER — Other Ambulatory Visit (HOSPITAL_COMMUNITY): Payer: Self-pay

## 2022-05-14 ENCOUNTER — Other Ambulatory Visit (HOSPITAL_COMMUNITY): Payer: Self-pay

## 2022-05-29 ENCOUNTER — Other Ambulatory Visit (HOSPITAL_COMMUNITY): Payer: Self-pay

## 2022-05-29 MED ORDER — BENZONATATE 100 MG PO CAPS
ORAL_CAPSULE | ORAL | 0 refills | Status: DC
  Filled 2022-05-29: qty 40, 10d supply, fill #0

## 2022-05-29 MED ORDER — BENZONATATE 200 MG PO CAPS
ORAL_CAPSULE | ORAL | 0 refills | Status: DC
  Filled 2022-05-29: qty 20, 7d supply, fill #0

## 2022-06-14 ENCOUNTER — Other Ambulatory Visit (HOSPITAL_COMMUNITY): Payer: Self-pay

## 2022-06-16 ENCOUNTER — Other Ambulatory Visit (HOSPITAL_COMMUNITY): Payer: Self-pay

## 2022-06-16 MED ORDER — ALPRAZOLAM 0.25 MG PO TABS
ORAL_TABLET | ORAL | 0 refills | Status: DC
  Filled 2022-06-16: qty 30, 30d supply, fill #0

## 2022-06-19 ENCOUNTER — Other Ambulatory Visit (HOSPITAL_COMMUNITY): Payer: Self-pay

## 2022-06-19 MED ORDER — AMOXICILLIN 500 MG PO CAPS
ORAL_CAPSULE | ORAL | 1 refills | Status: DC
  Filled 2022-06-19: qty 30, 10d supply, fill #0

## 2022-06-27 ENCOUNTER — Other Ambulatory Visit (HOSPITAL_COMMUNITY): Payer: Self-pay

## 2022-06-30 ENCOUNTER — Other Ambulatory Visit (HOSPITAL_COMMUNITY): Payer: Self-pay

## 2022-07-01 ENCOUNTER — Other Ambulatory Visit (HOSPITAL_COMMUNITY): Payer: Self-pay

## 2022-07-01 ENCOUNTER — Other Ambulatory Visit (HOSPITAL_BASED_OUTPATIENT_CLINIC_OR_DEPARTMENT_OTHER): Payer: Self-pay

## 2022-07-01 MED ORDER — FLUCONAZOLE 150 MG PO TABS
ORAL_TABLET | ORAL | 0 refills | Status: DC
Start: 2022-07-01 — End: 2023-07-10
  Filled 2022-07-01 – 2022-07-02 (×2): qty 2, 3d supply, fill #0

## 2022-07-02 ENCOUNTER — Other Ambulatory Visit (HOSPITAL_BASED_OUTPATIENT_CLINIC_OR_DEPARTMENT_OTHER): Payer: Self-pay

## 2022-07-02 ENCOUNTER — Other Ambulatory Visit (HOSPITAL_COMMUNITY): Payer: Self-pay

## 2022-07-22 ENCOUNTER — Other Ambulatory Visit (HOSPITAL_COMMUNITY): Payer: Self-pay

## 2022-08-12 ENCOUNTER — Other Ambulatory Visit (HOSPITAL_COMMUNITY): Payer: Self-pay

## 2022-08-19 ENCOUNTER — Other Ambulatory Visit (HOSPITAL_COMMUNITY): Payer: Self-pay

## 2022-08-20 ENCOUNTER — Other Ambulatory Visit (HOSPITAL_COMMUNITY): Payer: Self-pay

## 2022-08-20 MED ORDER — ALPRAZOLAM 0.25 MG PO TABS
ORAL_TABLET | ORAL | 0 refills | Status: DC
  Filled 2022-08-20: qty 30, 30d supply, fill #0

## 2022-09-23 ENCOUNTER — Other Ambulatory Visit (HOSPITAL_BASED_OUTPATIENT_CLINIC_OR_DEPARTMENT_OTHER): Payer: Self-pay

## 2022-09-23 ENCOUNTER — Other Ambulatory Visit (HOSPITAL_COMMUNITY): Payer: Self-pay

## 2022-09-23 MED ORDER — DOXYCYCLINE HYCLATE 100 MG PO CAPS
ORAL_CAPSULE | ORAL | 0 refills | Status: DC
  Filled 2022-09-23: qty 20, 10d supply, fill #0

## 2022-09-23 MED ORDER — FLUCONAZOLE 200 MG PO TABS
ORAL_TABLET | ORAL | 0 refills | Status: DC
Start: 2022-09-23 — End: 2023-07-10
  Filled 2022-09-23: qty 2, 3d supply, fill #0

## 2022-09-23 MED ORDER — ALPRAZOLAM 0.25 MG PO TABS
ORAL_TABLET | ORAL | 0 refills | Status: DC
  Filled 2022-09-23: qty 30, 30d supply, fill #0

## 2022-10-09 ENCOUNTER — Other Ambulatory Visit (HOSPITAL_COMMUNITY): Payer: Self-pay

## 2022-10-09 MED ORDER — TRETINOIN 0.05 % EX CREA
1.0000 | TOPICAL_CREAM | Freq: Every evening | CUTANEOUS | 2 refills | Status: DC
  Filled 2022-10-09: qty 45, 30d supply, fill #0

## 2022-10-13 ENCOUNTER — Other Ambulatory Visit (HOSPITAL_BASED_OUTPATIENT_CLINIC_OR_DEPARTMENT_OTHER): Payer: Self-pay

## 2022-10-13 MED ORDER — DOXYCYCLINE HYCLATE 100 MG PO TABS
100.0000 mg | ORAL_TABLET | Freq: Two times a day (BID) | ORAL | 0 refills | Status: DC
  Filled 2022-10-13 – 2022-10-15 (×2): qty 20, 10d supply, fill #0

## 2022-10-15 ENCOUNTER — Other Ambulatory Visit (HOSPITAL_BASED_OUTPATIENT_CLINIC_OR_DEPARTMENT_OTHER): Payer: Self-pay

## 2022-10-15 ENCOUNTER — Other Ambulatory Visit (HOSPITAL_COMMUNITY): Payer: Self-pay

## 2022-10-16 ENCOUNTER — Other Ambulatory Visit (HOSPITAL_COMMUNITY): Payer: Self-pay

## 2022-10-20 ENCOUNTER — Other Ambulatory Visit (HOSPITAL_COMMUNITY): Payer: Self-pay

## 2022-10-29 ENCOUNTER — Other Ambulatory Visit (HOSPITAL_BASED_OUTPATIENT_CLINIC_OR_DEPARTMENT_OTHER): Payer: Self-pay

## 2022-10-29 ENCOUNTER — Other Ambulatory Visit (HOSPITAL_COMMUNITY): Payer: Self-pay

## 2022-10-29 MED ORDER — AMOXICILLIN-POT CLAVULANATE 875-125 MG PO TABS
1.0000 | ORAL_TABLET | Freq: Two times a day (BID) | ORAL | 0 refills | Status: DC
  Filled 2022-10-29: qty 20, 10d supply, fill #0

## 2022-10-29 MED ORDER — FLUCONAZOLE 150 MG PO TABS
150.0000 mg | ORAL_TABLET | Freq: Every day | ORAL | 0 refills | Status: DC
  Filled 2022-10-29: qty 2, 2d supply, fill #0

## 2022-11-05 ENCOUNTER — Other Ambulatory Visit (HOSPITAL_COMMUNITY): Payer: Self-pay

## 2022-11-05 MED ORDER — ALPRAZOLAM 0.25 MG PO TABS
ORAL_TABLET | ORAL | 0 refills | Status: DC
  Filled 2022-11-05: qty 30, 30d supply, fill #0

## 2022-11-10 ENCOUNTER — Other Ambulatory Visit (HOSPITAL_COMMUNITY): Payer: Self-pay

## 2022-11-18 ENCOUNTER — Other Ambulatory Visit (HOSPITAL_COMMUNITY): Payer: Self-pay

## 2022-12-17 ENCOUNTER — Other Ambulatory Visit (HOSPITAL_BASED_OUTPATIENT_CLINIC_OR_DEPARTMENT_OTHER): Payer: Self-pay

## 2022-12-17 ENCOUNTER — Other Ambulatory Visit (HOSPITAL_COMMUNITY): Payer: Self-pay

## 2022-12-17 MED ORDER — FLUCONAZOLE 150 MG PO TABS
ORAL_TABLET | ORAL | 0 refills | Status: DC
  Filled 2022-12-17: qty 2, 3d supply, fill #0

## 2022-12-17 MED ORDER — AMOXICILLIN-POT CLAVULANATE 875-125 MG PO TABS
1.0000 | ORAL_TABLET | Freq: Two times a day (BID) | ORAL | 0 refills | Status: DC
  Filled 2022-12-17: qty 20, 10d supply, fill #0

## 2023-01-08 ENCOUNTER — Other Ambulatory Visit (HOSPITAL_COMMUNITY): Payer: Self-pay

## 2023-01-10 ENCOUNTER — Other Ambulatory Visit (HOSPITAL_COMMUNITY): Payer: Self-pay

## 2023-01-13 ENCOUNTER — Other Ambulatory Visit (HOSPITAL_COMMUNITY): Payer: Self-pay

## 2023-01-13 MED ORDER — ALPRAZOLAM 0.25 MG PO TABS
0.1250 mg | ORAL_TABLET | Freq: Every day | ORAL | 0 refills | Status: DC | PRN
  Filled 2023-01-13: qty 30, 30d supply, fill #0

## 2023-01-15 ENCOUNTER — Other Ambulatory Visit (HOSPITAL_COMMUNITY): Payer: Self-pay

## 2023-01-16 ENCOUNTER — Other Ambulatory Visit (HOSPITAL_COMMUNITY): Payer: Self-pay

## 2023-01-16 MED ORDER — CEFDINIR 300 MG PO CAPS
300.0000 mg | ORAL_CAPSULE | Freq: Two times a day (BID) | ORAL | 0 refills | Status: DC
  Filled 2023-01-16: qty 20, 10d supply, fill #0

## 2023-01-16 MED ORDER — CLINDAMYCIN HCL 300 MG PO CAPS
300.0000 mg | ORAL_CAPSULE | Freq: Three times a day (TID) | ORAL | 0 refills | Status: DC
  Filled 2023-01-16: qty 30, 10d supply, fill #0

## 2023-01-16 MED ORDER — AZELASTINE HCL 0.05 % OP SOLN
1.0000 [drp] | Freq: Two times a day (BID) | OPHTHALMIC | 2 refills | Status: DC
  Filled 2023-01-16: qty 6, 30d supply, fill #0

## 2023-01-16 MED ORDER — OMEPRAZOLE 20 MG PO CPDR
20.0000 mg | DELAYED_RELEASE_CAPSULE | Freq: Every day | ORAL | 3 refills | Status: DC
  Filled 2023-01-16: qty 90, 90d supply, fill #0
  Filled 2023-04-13: qty 90, 90d supply, fill #1
  Filled 2023-07-10: qty 90, 90d supply, fill #2
  Filled 2023-10-05: qty 90, 90d supply, fill #3

## 2023-01-19 ENCOUNTER — Other Ambulatory Visit (HOSPITAL_COMMUNITY): Payer: Self-pay

## 2023-01-19 MED ORDER — FLUCONAZOLE 150 MG PO TABS
ORAL_TABLET | ORAL | 0 refills | Status: DC
  Filled 2023-01-19: qty 2, 3d supply, fill #0

## 2023-01-26 ENCOUNTER — Other Ambulatory Visit (HOSPITAL_COMMUNITY): Payer: Self-pay

## 2023-01-26 MED ORDER — NEOMYCIN-POLYMYXIN-DEXAMETH 3.5-10000-0.1 OP SUSP
OPHTHALMIC | 0 refills | Status: DC
  Filled 2023-01-26: qty 5, 25d supply, fill #0

## 2023-03-06 ENCOUNTER — Other Ambulatory Visit (HOSPITAL_COMMUNITY): Payer: Self-pay

## 2023-03-18 ENCOUNTER — Other Ambulatory Visit (HOSPITAL_COMMUNITY): Payer: Self-pay

## 2023-03-19 ENCOUNTER — Other Ambulatory Visit (HOSPITAL_COMMUNITY): Payer: Self-pay

## 2023-03-19 MED ORDER — ALPRAZOLAM 0.25 MG PO TABS
0.1250 mg | ORAL_TABLET | Freq: Every day | ORAL | 0 refills | Status: AC | PRN
  Filled 2023-03-19: qty 90, 90d supply, fill #0

## 2023-04-01 ENCOUNTER — Other Ambulatory Visit (HOSPITAL_BASED_OUTPATIENT_CLINIC_OR_DEPARTMENT_OTHER): Payer: Self-pay

## 2023-04-01 MED ORDER — DIPHENOXYLATE-ATROPINE 2.5-0.025 MG PO TABS
1.0000 | ORAL_TABLET | Freq: Four times a day (QID) | ORAL | 0 refills | Status: DC
  Filled 2023-04-01: qty 12, 3d supply, fill #0

## 2023-04-13 ENCOUNTER — Other Ambulatory Visit (HOSPITAL_COMMUNITY): Payer: Self-pay

## 2023-04-22 ENCOUNTER — Other Ambulatory Visit (HOSPITAL_COMMUNITY): Payer: Self-pay

## 2023-04-22 MED ORDER — ESTRADIOL 0.05 MG/24HR TD PTTW
1.0000 | MEDICATED_PATCH | TRANSDERMAL | 3 refills | Status: DC
  Filled 2023-04-22: qty 24, 84d supply, fill #0

## 2023-04-28 ENCOUNTER — Other Ambulatory Visit (HOSPITAL_BASED_OUTPATIENT_CLINIC_OR_DEPARTMENT_OTHER): Payer: Self-pay

## 2023-04-28 MED ORDER — IBUPROFEN 800 MG PO TABS
800.0000 mg | ORAL_TABLET | Freq: Three times a day (TID) | ORAL | 0 refills | Status: AC
  Filled 2023-04-28: qty 60, 20d supply, fill #0

## 2023-05-06 ENCOUNTER — Other Ambulatory Visit (HOSPITAL_BASED_OUTPATIENT_CLINIC_OR_DEPARTMENT_OTHER): Payer: Self-pay

## 2023-05-08 ENCOUNTER — Other Ambulatory Visit (HOSPITAL_COMMUNITY): Payer: Self-pay

## 2023-05-14 ENCOUNTER — Other Ambulatory Visit (HOSPITAL_COMMUNITY): Payer: Self-pay

## 2023-05-19 ENCOUNTER — Other Ambulatory Visit (HOSPITAL_BASED_OUTPATIENT_CLINIC_OR_DEPARTMENT_OTHER): Payer: Self-pay

## 2023-05-19 MED ORDER — CYCLOBENZAPRINE HCL 5 MG PO TABS
5.0000 mg | ORAL_TABLET | Freq: Three times a day (TID) | ORAL | 0 refills | Status: DC
  Filled 2023-05-19: qty 60, 10d supply, fill #0

## 2023-05-20 ENCOUNTER — Other Ambulatory Visit (HOSPITAL_COMMUNITY): Payer: Self-pay

## 2023-05-21 ENCOUNTER — Other Ambulatory Visit (HOSPITAL_BASED_OUTPATIENT_CLINIC_OR_DEPARTMENT_OTHER): Payer: Self-pay

## 2023-05-21 ENCOUNTER — Other Ambulatory Visit: Payer: Self-pay

## 2023-05-21 ENCOUNTER — Other Ambulatory Visit (HOSPITAL_COMMUNITY): Payer: Self-pay

## 2023-05-21 MED ORDER — ESTRADIOL 0.05 MG/24HR TD PTTW
1.0000 | MEDICATED_PATCH | TRANSDERMAL | 3 refills | Status: DC
  Filled 2023-05-21 – 2023-07-10 (×2): qty 24, 84d supply, fill #0

## 2023-05-21 MED ORDER — EYSUVIS 0.25 % OP SUSP
OPHTHALMIC | 1 refills | Status: DC
  Filled 2023-05-21: qty 8.3, 14d supply, fill #0

## 2023-05-21 MED ORDER — PROGESTERONE MICRONIZED 100 MG PO CAPS
100.0000 mg | ORAL_CAPSULE | Freq: Every day | ORAL | 3 refills | Status: DC
  Filled 2023-05-21 – 2023-06-15 (×2): qty 90, 90d supply, fill #0
  Filled 2024-01-21: qty 90, 90d supply, fill #1

## 2023-05-21 MED ORDER — NEOMYCIN-POLYMYXIN-DEXAMETH 3.5-10000-0.1 OP SUSP
1.0000 [drp] | Freq: Four times a day (QID) | OPHTHALMIC | 0 refills | Status: DC
  Filled 2023-05-21: qty 5, 25d supply, fill #0

## 2023-05-22 ENCOUNTER — Other Ambulatory Visit (HOSPITAL_BASED_OUTPATIENT_CLINIC_OR_DEPARTMENT_OTHER): Payer: Self-pay

## 2023-05-22 MED ORDER — LOTEPREDNOL ETABONATE 0.5 % OP SUSP
OPHTHALMIC | 0 refills | Status: DC
  Filled 2023-05-22: qty 5, 25d supply, fill #0

## 2023-05-27 ENCOUNTER — Other Ambulatory Visit (HOSPITAL_COMMUNITY): Payer: Self-pay

## 2023-05-29 ENCOUNTER — Other Ambulatory Visit (HOSPITAL_COMMUNITY): Payer: Self-pay

## 2023-05-29 ENCOUNTER — Other Ambulatory Visit (HOSPITAL_BASED_OUTPATIENT_CLINIC_OR_DEPARTMENT_OTHER): Payer: Self-pay

## 2023-06-03 ENCOUNTER — Other Ambulatory Visit (HOSPITAL_BASED_OUTPATIENT_CLINIC_OR_DEPARTMENT_OTHER): Payer: Self-pay

## 2023-06-03 MED ORDER — AMOXICILLIN-POT CLAVULANATE 875-125 MG PO TABS
1.0000 | ORAL_TABLET | Freq: Two times a day (BID) | ORAL | 0 refills | Status: DC
  Filled 2023-06-03: qty 20, 10d supply, fill #0

## 2023-06-03 MED ORDER — FLUCONAZOLE 150 MG PO TABS
150.0000 mg | ORAL_TABLET | ORAL | 0 refills | Status: DC
  Filled 2023-06-03: qty 2, 3d supply, fill #0

## 2023-06-15 ENCOUNTER — Other Ambulatory Visit (HOSPITAL_COMMUNITY): Payer: Self-pay

## 2023-06-17 ENCOUNTER — Other Ambulatory Visit (HOSPITAL_BASED_OUTPATIENT_CLINIC_OR_DEPARTMENT_OTHER): Payer: Self-pay

## 2023-06-17 MED ORDER — GUAIFENESIN-CODEINE 100-10 MG/5ML PO SOLN
ORAL | 0 refills | Status: AC
  Filled 2023-06-17: qty 120, 10d supply, fill #0

## 2023-06-17 MED ORDER — SULFAMETHOXAZOLE-TRIMETHOPRIM 800-160 MG PO TABS
1.0000 | ORAL_TABLET | Freq: Two times a day (BID) | ORAL | 0 refills | Status: DC
  Filled 2023-06-17: qty 20, 10d supply, fill #0

## 2023-07-03 ENCOUNTER — Other Ambulatory Visit (HOSPITAL_BASED_OUTPATIENT_CLINIC_OR_DEPARTMENT_OTHER): Payer: Self-pay

## 2023-07-03 MED ORDER — ALPRAZOLAM 0.25 MG PO TABS
0.2500 mg | ORAL_TABLET | Freq: Every day | ORAL | 0 refills | Status: DC | PRN
  Filled 2023-07-03: qty 90, 90d supply, fill #0

## 2023-07-09 NOTE — Progress Notes (Signed)
Cardiology Office Note:  .   Date:  07/10/2023  ID:  Tracy Bishop, DOB [birth year], MRN [id number] PCP: [name], MD  [facility] Providers Cardiologist:  Orbie Pyo, MD    History of Present Illness: .   Tracy Bishop is a 57 y.o. female with past medical history of palpitations, former tobacco and alcohol abuse, hyperlipidemia, GERD and anxiety.   First seen by cardiology on 02/26/2022 by Dr. Lynnette Caffey for palpitations.  It was recommended that she have an echocardiogram and wear a cardiac monitor.  It was also recommended that she get a calcium score, does not appear that a calcium score or echocardiogram was completed.  Notes revealed that the cardiac monitor fell off within 24 hours with no results.  On chart review appears that she presented to urgent care on 07/02/2023 for palpitations.  She reported feeling that her heart was flipping over, her EKG showed normal sinus rhythm with a PVC.  She reported increased anxiety, noted to have taken a Xanax before walking into urgent care.  Per notes her blood pressure was elevated.  Today she presents with complaints of intermittent palpitations.  She reports that starting on 7/24 she had multiple palpitations daily until 7/31.  They then subsided.  She describes palpitations as a singular flip-flop sensation, denies feelings of tachycardia.  Denies shortness of breath or chest pain or lower extremity swelling.  On discussion she does report she stays well-hydrated, she drinks about 3 beers 4 times a week and reports that she does vape.  She also notes significant amount of stress lately with the passing of her stepfather and increased stress with work.  She questions if starting azelastine potentially caused her increase in palpitations.  She notes this past month having upper respiratory infections and having decreased food intake.  We also discussed her history of hyperlipidemia, most recent LDL was 170 in November 2023.  She has not  been on statin therapy.  She does report a family history of coronary artery disease in her mother with multiple stents, hyperlipidemia and atrial fibrillation, notes that her sister also has hyperlipidemia and has some history of "heart spasms" but she is unsure of details.  Labs 11/05/22: Hemoglobin 12.8, hematocrit 39.4, creatinine 0.59, sodium 139, potassium 4.5, AST 34, ALT 27  Cholesterol 274, triglycerides 78, HDL 91, and LDL 170.  ROS: Today she denies chest pain, shortness of breath, lower extremity edema, fatigue, melena, hematuria, hemoptysis, diaphoresis, weakness, presyncope, syncope, orthopnea, and PND. All other systems reviewed and are otherwise negative except as noted above.   Studies Reviewed: Marland Kitchen        DSE 2013 ECG REST   The baseline ECG displays normal sinus rhythm.   -   ECG STRESS   No diagnostic ST segment changes were seen. Negative stress ECG for inducible    ischemia at target heart rate. ECG changes, blood pressure and heart rate    responses during stress test are shown in the Cardiology Scan portion of this    report in the EPIC. Isolated PVC, sinus pause.   -   REST ECHO   Normal left ventricular function at rest. The estimated LV ejection fraction    is 55-60% . There were no segmental wall motion abnormalities at rest.   -   STRESS ECHO   Normal left ventricular function with stress. There were no segmental wall    motion abnormalities post exercise. The estimated LV ejection fraction is  60-65% . Negative exercise echocardiography for inducible ischemia at target    heart rate.  Physical Exam:   VS:  BP 122/82 (BP Location: Left Arm, Patient Position: Sitting, Cuff Size: Normal)   Pulse 72   Ht 5\' 5"  (1.651 m)   Wt 159 lb 12.8 oz (72.5 kg)   SpO2 96%   BMI 26.59 kg/m    Wt Readings from Last 3 Encounters:  07/10/23 159 lb 12.8 oz (72.5 kg)  02/26/22 166 lb (75.3 kg)  11/12/21 161 lb (73 kg)    GEN: Well nourished, well developed in no  acute distress NECK: No JVD; No carotid bruits CARDIAC: RRR, no murmurs, rubs, gallops RESPIRATORY:  Clear to auscultation without rales, wheezing or rhonchi  ABDOMEN: Soft, non-tender, non-distended EXTREMITIES:  No edema; No deformity   ASSESSMENT AND PLAN: .    Palpitations: Previously seen last year by Dr. Lavonna Monarch for palpitations.  Was recommended that she wear a cardiac monitor, monitor fell off within 12 hours she did not want to repeat.  It was recommended that she complete a CT calcium scoring, she has been unable to complete this.  Today she presents with worsening palpitations that occurred with increased frequency from 7/24 to 7/31, now with resolution.  Discussed possible triggers such as increased anxiety, vaping, medication use, alcohol, dehydration or electrolyte abnormalities.  She will increase her hydration and monitor for worsening palpitations.  She will also wear a cardiac monitor for 2 weeks for further evaluation.  She plans to return to [facility] to complete her CT calcium scoring as she has already paid for testing.  Check CBC, CMET, TSH and magnesium.   Hyperlipidemia: Last lipid profile on 11/05/22 indicated Cholesterol 274, triglycerides 78, HDL 91, and LDL 170.  Notes family history of coronary artery disease and hyperlipidemia. Check CMET, lipoprotein a and fasting lipid profile. Pending results plan to start on statin therapy.  Smoking history/Vaping: She reports history of cigarette smoking, notes that she stopped many years prior.  Currently vapes, recommended cessation, she declines at this time.  Family history of CAD: Notes significant family history of CAD in her mother with numerous stents.  She is unsure of age of mother's diagnosis.  Reports that her mother and sister both have hyperlipidemia.  She currently has no anginal symptoms.  No ischemic evaluation indicated this time.  Discussed risk factor stratification. Heart healthy diet and regular cardiovascular  exercise encouraged.      Dispo: Follow up with [name], FNP in 8-10  weeks.   Signed, Rip Harbour, NP

## 2023-07-10 ENCOUNTER — Encounter: Payer: Self-pay | Admitting: General Practice

## 2023-07-10 ENCOUNTER — Ambulatory Visit (INDEPENDENT_AMBULATORY_CARE_PROVIDER_SITE_OTHER): Payer: BC Managed Care – PPO

## 2023-07-10 ENCOUNTER — Other Ambulatory Visit: Payer: Self-pay

## 2023-07-10 ENCOUNTER — Ambulatory Visit: Payer: BC Managed Care – PPO | Attending: General Practice | Admitting: Cardiology

## 2023-07-10 ENCOUNTER — Other Ambulatory Visit (HOSPITAL_BASED_OUTPATIENT_CLINIC_OR_DEPARTMENT_OTHER): Payer: Self-pay

## 2023-07-10 VITALS — BP 122/82 | HR 72 | Ht 65.0 in | Wt 159.8 lb

## 2023-07-10 DIAGNOSIS — E785 Hyperlipidemia, unspecified: Secondary | ICD-10-CM | POA: Diagnosis not present

## 2023-07-10 DIAGNOSIS — R002 Palpitations: Secondary | ICD-10-CM

## 2023-07-10 DIAGNOSIS — Z720 Tobacco use: Secondary | ICD-10-CM

## 2023-07-10 LAB — COMPREHENSIVE METABOLIC PANEL

## 2023-07-10 LAB — TSH

## 2023-07-10 LAB — CBC
Hematocrit: 36.1 % (ref 34.0–46.6)
Hemoglobin: 12.2 g/dL (ref 11.1–15.9)
MCH: 32.4 pg (ref 26.6–33.0)
MCHC: 33.8 g/dL (ref 31.5–35.7)
MCV: 96 fL (ref 79–97)
Platelets: 314 10*3/uL (ref 150–450)
RBC: 3.76 x10E6/uL — ABNORMAL LOW (ref 3.77–5.28)
RDW: 12.9 % (ref 11.7–15.4)
WBC: 5.9 10*3/uL (ref 3.4–10.8)

## 2023-07-10 LAB — MAGNESIUM

## 2023-07-10 NOTE — Patient Instructions (Addendum)
Medication Instructions:  The current medical regimen is effective;  continue present plan and medications as directed. Please refer to the Current Medication list given to you today.  *If you need a refill on your cardiac medications before your next appointment, please call your pharmacy*  Lab Work: CBC, CMET, TSH AND MAG TODAY; FASTING LIPID AND Lpa- NEXT WEEK If you have labs (blood work) drawn today and your tests are completely normal, you will receive your results only by:  MyChart Message (if you have MyChart) OR  A paper copy in the mail If you have any lab test that is abnormal or we need to change your treatment, we will call you to review the results.  Testing/Procedures: Your physician has requested you wear a ZIO patch monitor for 14 days. -SEE BELOW  Follow-Up: At [facility], you and your health needs are our priority.  As part of our continuing mission to provide you with exceptional heart care, we have created designated Provider Care Teams.  These Care Teams include your primary Cardiologist (physician) and Advanced Practice Providers (APPs -  Physician Assistants and Nurse Practitioners) who all work together to provide you with the care you need, when you need it.  We recommend signing up for the patient portal called "MyChart".  Sign up information is provided on this After Visit Summary.  MyChart is used to connect with patients for Virtual Visits (Telemedicine).  Patients are able to view lab/test results, encounter notes, upcoming appointments, etc.  Non-urgent messages can be sent to your provider as well.   To learn more about what you can do with MyChart, go to [email or web address].    Your next appointment:   8-10 week(s)  Provider:   Orbie Pyo, MD or [name], FNP or Reather Littler, NP         Other Instructions Tracy Bishop- Long Term Monitor Instructions  Your physician has requested you wear a ZIO patch monitor for 14 days.  This is a single  patch monitor. [employer] supplies one patch monitor per enrollment. Additional stickers are not available. Please do not apply patch if you will be having a Nuclear Stress Test,  Echocardiogram, Cardiac CT, MRI, or Chest Xray during the period you would be wearing the  monitor. The patch cannot be worn during these tests. You cannot remove and re-apply the  ZIO XT patch monitor.  Your ZIO patch monitor will be mailed 3 day USPS to your address on file. It may take 3-5 days  to receive your monitor after you have been enrolled.  Once you have received your monitor, please review the enclosed instructions. Your monitor  has already been registered assigning a specific monitor serial # to you.  Billing and Patient Assistance Program Information  We have supplied [employer] with any of your insurance information on file for billing purposes. [employer] offers a sliding scale Patient Assistance Program for patients that do not have  insurance, or whose insurance does not completely cover the cost of the ZIO monitor.  You must apply for the Patient Assistance Program to qualify for this discounted rate.  To apply, please call [employer] at [phone], select option 4, select option 2, ask to apply for  Patient Assistance Program. Meredeth Ide will ask your household income, and how many people  are in your household. They will quote your out-of-pocket cost based on that information.  [employer] will also be able to set up a 76-month, interest-free payment plan if needed.  Applying  the monitor   Shave hair from upper left chest.  Hold abrader disc by orange tab. Rub abrader in 40 strokes over the upper left chest as  indicated in your monitor instructions.  Clean area with 4 enclosed alcohol pads. Let dry.  Apply patch as indicated in monitor instructions. Patch will be placed under collarbone on left  side of chest with arrow pointing upward.  Rub patch adhesive wings for 2 minutes. Remove white label marked  "1". Remove the white  label marked "2". Rub patch adhesive wings for 2 additional minutes.  While looking in a mirror, press and release button in center of patch. A small green light will  flash 3-4 times. This will be your only indicator that the monitor has been turned on.  Do not shower for the first 24 hours. You may shower after the first 24 hours.  Press the button if you feel a symptom. You will hear a small click. Record Date, Time and  Symptom in the Patient Logbook.  When you are ready to remove the patch, follow instructions on the last 2 pages of Patient  Logbook. Stick patch monitor onto the last page of Patient Logbook.  Place Patient Logbook in the blue and white box. Use locking tab on box and tape box closed  securely. The blue and white box has prepaid postage on it. Please place it in the mailbox as  soon as possible. Your physician should have your test results approximately 7 days after the  monitor has been mailed back to [facility].  Call [facility] Customer Care at [phone] if you have questions regarding  your ZIO XT patch monitor. Call them immediately if you see an orange light blinking on your  monitor.  If your monitor falls off in less than 4 days, contact our Monitor department at [phone].  If your monitor becomes loose or falls off after 4 days call [employer] at [phone] for  suggestions on securing your monitor

## 2023-07-10 NOTE — Progress Notes (Unsigned)
Enrolled patient for a 14 day Zio XT monitor to be mailed to patients home  Thukkani to read

## 2023-07-15 NOTE — Addendum Note (Signed)
Addended by: Hayden Pedro A on: 07/15/2023 02:50 PM   Modules accepted: Orders

## 2023-07-20 ENCOUNTER — Other Ambulatory Visit (HOSPITAL_COMMUNITY): Payer: Self-pay

## 2023-07-20 MED ORDER — ROSUVASTATIN CALCIUM 10 MG PO TABS
10.0000 mg | ORAL_TABLET | Freq: Every day | ORAL | 3 refills | Status: DC
  Filled 2023-07-20: qty 90, 90d supply, fill #0

## 2023-07-29 ENCOUNTER — Other Ambulatory Visit (HOSPITAL_BASED_OUTPATIENT_CLINIC_OR_DEPARTMENT_OTHER): Payer: Self-pay

## 2023-07-29 MED ORDER — ONDANSETRON HCL 8 MG PO TABS
8.0000 mg | ORAL_TABLET | Freq: Three times a day (TID) | ORAL | 1 refills | Status: DC | PRN
  Filled 2023-07-29: qty 30, 10d supply, fill #0

## 2023-07-29 MED ORDER — CEFDINIR 300 MG PO CAPS
300.0000 mg | ORAL_CAPSULE | Freq: Two times a day (BID) | ORAL | 0 refills | Status: AC
  Filled 2023-07-29: qty 20, 10d supply, fill #0

## 2023-07-29 MED ORDER — FLUCONAZOLE 200 MG PO TABS
ORAL_TABLET | ORAL | 0 refills | Status: DC
  Filled 2023-07-29: qty 2, 3d supply, fill #0

## 2023-09-05 ENCOUNTER — Other Ambulatory Visit (HOSPITAL_BASED_OUTPATIENT_CLINIC_OR_DEPARTMENT_OTHER): Payer: Self-pay

## 2023-09-05 MED ORDER — FLUCONAZOLE 150 MG PO TABS
150.0000 mg | ORAL_TABLET | ORAL | 0 refills | Status: DC
  Filled 2023-09-05: qty 2, 4d supply, fill #0

## 2023-09-05 MED ORDER — AMOXICILLIN-POT CLAVULANATE 875-125 MG PO TABS
1.0000 | ORAL_TABLET | Freq: Two times a day (BID) | ORAL | 0 refills | Status: DC
  Filled 2023-09-05: qty 20, 10d supply, fill #0

## 2023-09-08 ENCOUNTER — Ambulatory Visit: Payer: BC Managed Care – PPO | Admitting: General Practice

## 2023-09-13 DIAGNOSIS — R002 Palpitations: Secondary | ICD-10-CM | POA: Diagnosis not present

## 2023-09-16 ENCOUNTER — Other Ambulatory Visit (HOSPITAL_BASED_OUTPATIENT_CLINIC_OR_DEPARTMENT_OTHER): Payer: Self-pay

## 2023-09-16 MED ORDER — NEOMYCIN-POLYMYXIN-DEXAMETH 0.1 % OP SUSP
1.0000 [drp] | Freq: Four times a day (QID) | OPHTHALMIC | 0 refills | Status: AC
  Filled 2023-09-16: qty 5, 14d supply, fill #0

## 2023-09-28 ENCOUNTER — Other Ambulatory Visit (HOSPITAL_BASED_OUTPATIENT_CLINIC_OR_DEPARTMENT_OTHER): Payer: Self-pay

## 2023-09-28 ENCOUNTER — Other Ambulatory Visit (HOSPITAL_COMMUNITY): Payer: Self-pay

## 2023-09-28 MED ORDER — PROGESTERONE MICRONIZED 100 MG PO CAPS
100.0000 mg | ORAL_CAPSULE | Freq: Every day | ORAL | 3 refills | Status: DC
  Filled 2023-09-28: qty 90, 90d supply, fill #0

## 2023-09-28 MED ORDER — ESTRADIOL 0.05 MG/24HR TD PTTW
1.0000 | MEDICATED_PATCH | TRANSDERMAL | 3 refills | Status: DC
  Filled 2023-09-28: qty 24, 84d supply, fill #0

## 2023-09-29 ENCOUNTER — Other Ambulatory Visit (HOSPITAL_COMMUNITY): Payer: Self-pay

## 2023-09-30 ENCOUNTER — Other Ambulatory Visit (HOSPITAL_COMMUNITY): Payer: Self-pay

## 2023-09-30 ENCOUNTER — Other Ambulatory Visit (HOSPITAL_BASED_OUTPATIENT_CLINIC_OR_DEPARTMENT_OTHER): Payer: Self-pay

## 2023-10-04 NOTE — Progress Notes (Unsigned)
Cardiology Clinic Note   Patient Name: Tracy Bishop Date of Encounter: 10/06/2023  Primary Care Provider:  [name], MD Primary Cardiologist:  Orbie Pyo, MD  Patient Profile    Tracy Bishop 57 year old female presents to the clinic today for follow-up evaluation of her palpitations.  Past Medical History    Past Medical History:  Diagnosis Date   COVID-19    GERD (gastroesophageal reflux disease)    Hyperlipidemia    Palpitations    Sinusitis    Past Surgical History:  Procedure Laterality Date   CESAREAN SECTION     INDUCED ABORTION      Allergies  Allergies  Allergen Reactions   Levofloxacin Hives   Penicillins Rash    History of Present Illness    Tracy Bishop has a PMH of anxiety, GERD, hyperlipidemia, tobacco abuse, alcohol abuse, and palpitations.  She establish care with cardiology 3/23.  She was seen by Dr.Thukkani.  A echocardiogram and coronary calcium score were recommended.  A cardiac event monitor was also recommended.  Her cardiac event monitor fell off within 24 hours and no results were noted.  Her calcium score and echocardiogram were not completed.  She was seen in urgent care 07/02/2023 for palpitations.  She reported a flipping type sensation.  Her EKG showed sinus rhythm with PVCs.  She reported increased anxiety.  She reported that she had taken Xanax before presenting to urgent care.  She was seen in follow-up by Reather Littler NP on 8-24.  During that time she noted intermittent palpitations.  She noted that they could happen multiple times per day.  She described them as a flip-flop type sensation she denied fast heart rates.  She denied shortness of breath and chest pain.  She had no lower extremity swelling.  She reported that she stayed well-hydrated.  She also was drinking about 3 beers 4 times per week and reported that she did vape.  She indicated that she had significant amount of stress related to the passing of her  stepfather and work.  She questions whether azelastine could have caused her to have increased palpitations she reported over the past month she had URI and had decreased her food intake.  Her elevated LDL was discussed.  She had not been on statin therapy.  She also noted a family history of coronary artery disease with multiple stents hyperlipidemia and atrial fibrillation.  Her blood pressure was noted to be 122/82.  She reported increased palpitations from 7/24 until 7/31.  But noted after that time they had resolved.  Triggers for palpitations were reviewed.  She reported that she would increase her hydration to week cardiac event monitor was also ordered.  She was noted to have an average heart rate of 89, 3 short runs of SVT, and an average heart rate of 89.  Her triggered events were associated with NSR.  She presents to the clinic today for follow-up evaluation and states she did not do well taking rosuvastatin.  She reduced the medication 2 to 3 days/week.  She would like to stop the medication.  We reviewed other options for statin therapy.  She expressed understanding.  She is a sleep medicine representative.  We reviewed sleep apnea.  We reviewed high-fiber diet and reducing cholesterol.  She expressed understanding.  She is fairly physically active.  I will start ezetimibe, plan repeat fasting lipids and LFTs in February and plan follow-up after lab work.  Today she denies chest  pain, shortness of breath, lower extremity edema, fatigue, palpitations, melena, hematuria, hemoptysis, diaphoresis, weakness, presyncope, syncope, orthopnea, and PND.    Home Medications    Prior to Admission medications   Medication Sig Start Date End Date Taking? Authorizing Provider  ALPRAZolam (XANAX) 0.25 MG tablet Take 1/2 - 1 tablet by mouth daily as needed for sleep. 03/19/23     amoxicillin-clavulanate (AUGMENTIN) 875-125 MG tablet Take 1 tablet by mouth 2 (two) times daily for 10 days 09/05/23      azelastine (ASTELIN) 0.1 % nasal spray Place two sprays into both nostrils 2 (two) times daily. 01/08/22     azelastine (OPTIVAR) 0.05 % ophthalmic solution Place one drop into both eyes 2 (two) times daily. 01/08/22     benzonatate (TESSALON) 100 MG capsule as needed.    [provider]  BIOTIN PO biotin    [provider]  diphenhydrAMINE (SOMINEX) 25 MG tablet Take by mouth.    [provider]  estradiol (VIVELLE-DOT) 0.05 MG/24HR patch Apply 1 patch topically twice a week. 03/25/22     estradiol (VIVELLE-DOT) 0.05 MG/24HR patch Place 1 patch (0.05 mg total) onto the skin 2 (two) times a week. 09/28/23     fluconazole (DIFLUCAN) 100 MG tablet Take 2 tablets today and then 1 tablet daily to complete a 10 day course 03/18/22     fluconazole (DIFLUCAN) 150 MG tablet Take 1 tablet (150 mg) by mouth on day 1, take 1 additional tablet on day 4 if symptoms persist 09/05/23     fluconazole (DIFLUCAN) 200 MG tablet Take 1 tablet by mouth now and repeat in 3 days 07/29/23     fluticasone (FLONASE) 50 MCG/ACT nasal spray Place 1 spray into both nostrils 2 (two) times daily as needed. 06/08/19   [provider]  guaiFENesin-codeine 100-10 MG/5ML syrup Take 5-10 mLs by mouth 4 (four) times a day as needed for ough for up to 10 days. Max Daily Amount: 40 mLs 06/17/23     ibuprofen (ADVIL) 800 MG tablet Take 1 tablet (800 mg total) by mouth every 8 (eight) hours as needed for pain. 04/28/23     Loratadine (CLARITIN PO) daily as needed.    [provider]  Multiple Vitamins-Minerals (ZINC PO) zinc    [provider]  neomycin-polymyxin-dexamethasone (MAXITROL) 0.1 % ophthalmic suspension Instill 1 drop into both eyes four times a day as directed 09/16/23     omeprazole (PRILOSEC) 20 MG capsule Take 1 capsule (20 mg total) by mouth daily. 01/16/23     ondansetron (ZOFRAN) 4 MG tablet Take one tablet by mouth every 8 (eight) hours as needed for Nausea. 02/11/22      ondansetron (ZOFRAN) 8 MG tablet Take 1 tablet (8 mg total) by mouth every 8 (eight) hours as needed for Nausea. 07/29/23     progesterone (PROMETRIUM) 100 MG capsule Take 1 capsule (100 mg total) by mouth daily. 05/21/23     progesterone (PROMETRIUM) 100 MG capsule Take 1 capsule (100 mg total) by mouth daily. 09/28/23     Pyridoxine HCl (B-6 PO) Take by mouth.    [provider]  rosuvastatin (CRESTOR) 10 MG tablet Take 1 tablet (10 mg) by mouth daily. 07/20/23 10/18/23  Orbie Pyo, MD  SUMAtriptan (IMITREX) 50 MG tablet Take one tablet by mouth once as needed for Migraine for up to 1 dose. 02/11/22     tretinoin (RETIN-A) 0.025 % cream 1 application to affected area in the evening to face  08/19/13   [provider]  vitamin B-12 (CYANOCOBALAMIN) 100 MCG tablet Place under the tongue.    [provider]  VITAMIN D PO Vitamin D    [provider]  VITAMIN E PO vitamin E    [provider]  zinc gluconate 50 MG tablet Take by mouth as needed.    [provider]    Family History    Family History  Problem Relation Age of Onset   Colon cancer Neg Hx    Colon polyps Neg Hx    Esophageal cancer Neg Hx    Stomach cancer Neg Hx    Rectal cancer Neg Hx    She indicated that the status of her neg hx is unknown.  Social History    Social History   Socioeconomic History   Marital status: Single    Spouse name: Not on file   Number of children: Not on file   Years of education: Not on file   Highest education level: Not on file  Occupational History   Not on file  Tobacco Use   Smoking status: Former    Types: Cigarettes   Smokeless tobacco: Never  Vaping Use   Vaping status: Some Days  Substance and Sexual Activity   Alcohol use: Yes    Comment: 2-3 times aweek   Drug use: Not Currently   Sexual activity: Not on file  Other Topics Concern   Not on file  Social History Narrative   Not on file   Social Determinants of  Health   Financial Resource Strain: Low Risk  (01/16/2023)   Received from [facility]   Overall Financial Resource Strain (CARDIA)    Difficulty of Paying Living Expenses: Not hard at all  Food Insecurity: No Food Insecurity (01/16/2023)   Received from [facility]   Hunger Vital Sign    Worried About Running Out of Food in the Last Year: Never true    Ran Out of Food in the Last Year: Never true  Transportation Needs: No Transportation Needs (01/16/2023)   Received from [facility] - Transportation    Lack of Transportation (Medical): No    Lack of Transportation (Non-Medical): No  Physical Activity: Sufficiently Active (11/05/2022)   Received from [facility], [facility]   Exercise Vital Sign    Days of Exercise per Week: 4 days    Minutes of Exercise per Session: 50 min  Stress: Stress Concern Present (11/05/2022)   Received from [facility], [facility] - Occupational Stress Questionnaire    Feeling of Stress : Rather much  Social Connections: Unknown (04/08/2022)   Received from [facility], [facility]   Social Network    Social Network: Not on file  Intimate Partner Violence: Not At Risk (11/05/2022)   Received from [facility], [facility]   Humiliation, Afraid, Rape, and Kick questionnaire    Fear of Current or Ex-Partner: No    Emotionally Abused: No    Physically Abused: No    Sexually Abused: No     Review of Systems    General:  No chills, fever, night sweats or weight changes.  Cardiovascular:  No chest pain, dyspnea on exertion, edema, orthopnea, palpitations, paroxysmal nocturnal dyspnea. Dermatological: No rash, lesions/masses Respiratory: No cough, dyspnea Urologic: No hematuria, dysuria Abdominal:   No nausea, vomiting, diarrhea, bright red blood per rectum, melena, or hematemesis Neurologic:  No visual changes, wkns, changes in  mental status. All other systems reviewed and are  otherwise negative except as noted above.  Physical Exam    VS:  BP 124/72 (BP Location: Left Arm, Patient Position: Sitting, Cuff Size: Normal)   Pulse 81   Ht 5\' 5"  (1.651 m)   Wt 166 lb 3.2 oz (75.4 kg)   SpO2 98%   BMI 27.66 kg/m  , BMI Body mass index is 27.66 kg/m. GEN: Well nourished, well developed, in no acute distress. HEENT: normal. Neck: Supple, no JVD, carotid bruits, or masses. Cardiac: RRR, no murmurs, rubs, or gallops. No clubbing, cyanosis, edema.  Radials/DP/PT 2+ and equal bilaterally.  Respiratory:  Respirations regular and unlabored, clear to auscultation bilaterally. GI: Soft, nontender, nondistended, BS + x 4. MS: no deformity or atrophy. Skin: warm and dry, no rash. Neuro:  Strength and sensation are intact. Psych: Normal affect.  Accessory Clinical Findings    Recent Labs: 07/10/2023: ALT 21; BUN 12; Creatinine, Ser 0.62; Hemoglobin 12.2; Magnesium 2.2; Platelets 314; Potassium 4.9; Sodium 139; TSH 1.730   Recent Lipid Panel    Component Value Date/Time   CHOL 283 (H) 07/14/2023 0924   TRIG 74 07/14/2023 0924   HDL 96 07/14/2023 0924   CHOLHDL 2.9 07/14/2023 0924   LDLCALC 175 (H) 07/14/2023 0924         ECG personally reviewed by me today-none today.    Cardiac event monitor 09/29/2023   Patch Wear Time:  10 days and 10 hours (2024-10-06T21:53:53-0400 to 2024-10-17T08:46:38-0400)   Patient had a min HR of 60 bpm, max HR of 190 bpm, and avg HR of 89 bpm. Predominant underlying rhythm was Sinus Rhythm.    EVENTS: -3 Supraventricular Tachycardia runs occurred, the run with the fastest interval lasting 7 beats with a max rate of 190 bpm, the longest lasting 7 beats with an avg rate of 140 bpm.    -Isolated SVEs were rare (<1.0%), SVE Couplets were rare (<1.0%), and SVE Triplets were rare (<1.0%).    -Isolated VEs were rare (<1.0%), and no VE Couplets or VE Triplets were present.    No atrial fibrillation, sustained ventricular  tachyarrhythmias, or bradyarrhythmias were detected.   Patient triggered events corresponded with sinus rhythm.     Assessment & Plan   1.  Palpitations-heart rate today 81bpm.  Wore cardiac event monitor which showed average heart rate of 89, 3 short runs of SVT, and triggered events were associated with sinus rhythm.  Lab work reassuring Maintain p.o. hydration Avoid triggers caffeine, chocolate, EtOH, dehydration etc. Continue to monitor  Hyperlipidemia-LDL 175 on 07/14/23.  Does not like taking statin medication.  Previously had intolerance to rosuvastatin and was asked to reduce dose.  Coronary calcium scoring 2.  Done at outside lab. Start ezetimibe 10 mg daily Stop rosuvastatin High-fiber diet Increase physical activity as tolerated  Tobacco abuse/vaping-nicotine cessation strongly recommended. Smoking cessation information provided  Disposition: Follow-up with Dr.Thukkani or me in 9-12 months.   Thomasene Ripple. Amaka Gluth NP-C     10/06/2023, 3:19 PM [facility] [address] Office [phone] Fax [phone]    I spent 14 minutes examining this patient, reviewing medications, and using patient centered shared decision making involving her cardiac care.   I spent greater than 20 minutes reviewing her past medical history,  medications, and prior cardiac tests.

## 2023-10-05 ENCOUNTER — Other Ambulatory Visit (HOSPITAL_COMMUNITY): Payer: Self-pay

## 2023-10-06 ENCOUNTER — Encounter: Payer: Self-pay | Admitting: General Practice

## 2023-10-06 ENCOUNTER — Other Ambulatory Visit (HOSPITAL_COMMUNITY): Payer: Self-pay

## 2023-10-06 ENCOUNTER — Ambulatory Visit: Payer: BC Managed Care – PPO | Attending: General Practice | Admitting: General Practice

## 2023-10-06 VITALS — BP 124/72 | HR 81 | Ht 65.0 in | Wt 166.2 lb

## 2023-10-06 DIAGNOSIS — E785 Hyperlipidemia, unspecified: Secondary | ICD-10-CM | POA: Diagnosis not present

## 2023-10-06 DIAGNOSIS — R002 Palpitations: Secondary | ICD-10-CM | POA: Diagnosis not present

## 2023-10-06 DIAGNOSIS — Z720 Tobacco use: Secondary | ICD-10-CM | POA: Diagnosis not present

## 2023-10-06 MED ORDER — EZETIMIBE 10 MG PO TABS
10.0000 mg | ORAL_TABLET | Freq: Every day | ORAL | 6 refills | Status: DC
Start: 2023-10-06 — End: 2024-08-15
  Filled 2023-10-06 – 2023-12-11 (×4): qty 30, 30d supply, fill #0
  Filled 2024-01-04: qty 30, 30d supply, fill #1
  Filled 2024-02-08: qty 30, 30d supply, fill #2
  Filled 2024-03-24: qty 30, 30d supply, fill #3
  Filled 2024-04-19: qty 30, 30d supply, fill #4
  Filled 2024-06-14: qty 30, 30d supply, fill #5
  Filled 2024-07-18: qty 30, 30d supply, fill #6

## 2023-10-06 NOTE — Patient Instructions (Signed)
Medication Instructions:  STOP ROSUVASTATIN START ezetimibe (ZETIA) 10 MG tablet DAILY *If you need a refill on your cardiac medications before your next appointment, please call your pharmacy*  Lab Work: FASTING LIPID AND LFT IN FEB 2025 If you have labs (blood work) drawn today and your tests are completely normal, you will receive your results only by:   MyChart Message (if you have MyChart) OR  A paper copy in the mail If you have any lab test that is abnormal or we need to change your treatment, we will call you to review the results.  Follow-Up: At [facility], you and your health needs are our priority.  As part of our continuing mission to provide you with exceptional heart care, we have created designated Provider Care Teams.  These Care Teams include your primary Cardiologist (physician) and Advanced Practice Providers (APPs -  Physician Assistants and Nurse Practitioners) who all work together to provide you with the care you need, when you need it.  Your next appointment:   6 month(s)  Provider:   Orbie Pyo, MD  or [name], FNP        High-Fiber Eating Plan Fiber, also called dietary fiber, is found in foods such as fruits, vegetables, whole grains, and beans. A high-fiber diet can be good for your health. Your health care provider may recommend a high-fiber diet to help: Prevent trouble pooping (constipation). Lower your cholesterol. Treat the following conditions: Hemorrhoids. This is inflammation of veins in the anus. Inflammation of specific areas of the digestive tract. Irritable bowel syndrome (IBS). This is a problem of the large intestine, also called the colon, that sometimes causes belly pain and bloating. Prevent overeating as part of a weight-loss plan. Lower the risk of heart disease, type 2 diabetes, and certain cancers. What are tips for following this plan? Reading food labels  Check the nutrition facts label on foods for the amount  of dietary fiber. Choose foods that have 4 grams of fiber or more per serving. The recommended goals for how much fiber you should eat each day include: Males 48 years old or younger: 30-34 g. Males over 86 years old: 28-34 g. Females 31 years old or younger: 25-28 g. Females over 82 years old: 22-25 g. Your daily fiber goal is _____________ g. Shopping Choose whole fruits and vegetables instead of processed. For example, choose apples instead of apple juice or applesauce. Choose a variety of high-fiber foods such as avocados, lentils, oats, and pinto beans. Read the nutrition facts label on foods. Check for foods with added fiber. These foods often have high sugar and salt (sodium) amounts per serving. Cooking Use whole-grain flour for baking and cooking. Cook with brown rice instead of white rice. Make meals that have a lot of beans and vegetables in them, such as chili or vegetable-based soups. Meal planning Start the day with a breakfast that is high in fiber, such as a cereal that has 5 g of fiber or more per serving. Eat breads and cereals that are made with whole-grain flour instead of refined flour or white flour. Eat brown rice, bulgur wheat, or millet instead of white rice. Use beans in place of meat in soups, salads, and pasta dishes. Be sure that half of the grains you eat each day are whole grains. General information You can get the recommended amount of dietary fiber by: Eating a variety of fruits, vegetables, grains, nuts, and beans. Taking a fiber supplement if you aren't  able to eat enough fiber. It's better to get fiber through food than from a supplement. Slowly increase how much fiber you eat. If you increase the amount of fiber you eat too quickly, you may have bloating, cramping, or gas. Drink plenty of water to help you digest fiber. Choose high-fiber snacks, such as berries, raw vegetables, nuts, and popcorn. What foods should I eat? Fruits Berries. Pears.  Apples. Oranges. Avocado. Prunes and raisins. Dried figs. Vegetables Sweet potatoes. Spinach. Kale. Artichokes. Cabbage. Broccoli. Cauliflower. Green peas. Carrots. Squash. Grains Whole-grain breads. Multigrain cereal. Oats and oatmeal. Brown rice. Barley. Bulgur wheat. Millet. Quinoa. Bran muffins. Popcorn. Rye wafer crackers. Meats and other proteins Navy beans, kidney beans, and pinto beans. Soybeans. Split peas. Lentils. Nuts and seeds. Dairy Fiber-fortified yogurt. Fortified means that fiber has been added to the product. Beverages Fiber-fortified soy milk. Fiber-fortified orange juice. Other foods Fiber bars. The items listed above may not be all the foods and drinks you can have. Talk to a dietitian to learn more. What foods should I avoid? Fruits Fruit juice. Cooked, strained fruit. Vegetables Fried potatoes. Canned vegetables. Well-cooked vegetables. Grains White bread. Pasta made with refined flour. White rice. Meats and other proteins Fatty meat. Fried chicken or fried fish. Dairy Milk. Cream cheese. Sour cream. Fats and oils Butters. Beverages Soft drinks. Other foods Cakes and pastries. The items listed above may not be all the foods and drinks you should avoid. Talk to a dietitian to learn more. This information is not intended to replace advice given to you by your health care provider. Make sure you discuss any questions you have with your health care provider. Document Revised: 02/16/2023 Document Reviewed: 02/16/2023 Elsevier Patient Education  2024 [employer].

## 2023-10-08 LAB — COLOGUARD: COLOGUARD: NEGATIVE

## 2023-10-08 LAB — EXTERNAL GENERIC LAB PROCEDURE: COLOGUARD: NEGATIVE

## 2023-10-16 ENCOUNTER — Other Ambulatory Visit (HOSPITAL_COMMUNITY): Payer: Self-pay

## 2023-10-21 ENCOUNTER — Other Ambulatory Visit (HOSPITAL_COMMUNITY): Payer: Self-pay

## 2023-10-24 ENCOUNTER — Other Ambulatory Visit (HOSPITAL_BASED_OUTPATIENT_CLINIC_OR_DEPARTMENT_OTHER): Payer: Self-pay

## 2023-10-24 MED ORDER — AMOXICILLIN-POT CLAVULANATE 875-125 MG PO TABS
ORAL_TABLET | ORAL | 0 refills | Status: DC
  Filled 2023-10-24: qty 14, 7d supply, fill #0

## 2023-11-02 ENCOUNTER — Other Ambulatory Visit (HOSPITAL_COMMUNITY): Payer: Self-pay

## 2023-11-04 ENCOUNTER — Other Ambulatory Visit (HOSPITAL_COMMUNITY): Payer: Self-pay

## 2023-11-04 MED ORDER — AMOXICILLIN-POT CLAVULANATE 875-125 MG PO TABS
1.0000 | ORAL_TABLET | Freq: Two times a day (BID) | ORAL | 0 refills | Status: DC
  Filled 2023-11-04: qty 20, 10d supply, fill #0

## 2023-11-16 ENCOUNTER — Other Ambulatory Visit (HOSPITAL_COMMUNITY): Payer: Self-pay

## 2023-11-25 ENCOUNTER — Other Ambulatory Visit (HOSPITAL_BASED_OUTPATIENT_CLINIC_OR_DEPARTMENT_OTHER): Payer: Self-pay

## 2023-11-25 MED ORDER — PREDNISONE 20 MG PO TABS
40.0000 mg | ORAL_TABLET | Freq: Every morning | ORAL | 0 refills | Status: AC
  Filled 2023-11-25: qty 10, 5d supply, fill #0

## 2023-11-25 MED ORDER — LEVOCETIRIZINE DIHYDROCHLORIDE 5 MG PO TABS
5.0000 mg | ORAL_TABLET | Freq: Every evening | ORAL | 5 refills | Status: DC
  Filled 2023-11-25: qty 30, 30d supply, fill #0
  Filled 2024-02-02 (×2): qty 30, 30d supply, fill #1
  Filled 2024-03-09: qty 30, 30d supply, fill #2
  Filled 2024-04-19: qty 30, 30d supply, fill #3
  Filled 2024-06-08: qty 30, 30d supply, fill #4
  Filled 2024-07-16: qty 30, 30d supply, fill #5

## 2023-11-25 MED ORDER — DOXYCYCLINE HYCLATE 100 MG PO TABS
100.0000 mg | ORAL_TABLET | Freq: Two times a day (BID) | ORAL | 0 refills | Status: AC
  Filled 2023-11-25: qty 20, 10d supply, fill #0

## 2023-11-25 MED ORDER — FLUTICASONE PROPIONATE 50 MCG/ACT NA SUSP
1.0000 | Freq: Every day | NASAL | 5 refills | Status: AC
  Filled 2023-11-25: qty 16, 30d supply, fill #0

## 2023-11-26 ENCOUNTER — Other Ambulatory Visit (HOSPITAL_BASED_OUTPATIENT_CLINIC_OR_DEPARTMENT_OTHER): Payer: Self-pay

## 2023-12-11 ENCOUNTER — Other Ambulatory Visit (HOSPITAL_BASED_OUTPATIENT_CLINIC_OR_DEPARTMENT_OTHER): Payer: Self-pay

## 2023-12-17 ENCOUNTER — Other Ambulatory Visit: Payer: Self-pay

## 2023-12-17 ENCOUNTER — Other Ambulatory Visit (HOSPITAL_BASED_OUTPATIENT_CLINIC_OR_DEPARTMENT_OTHER): Payer: Self-pay

## 2023-12-18 ENCOUNTER — Other Ambulatory Visit (HOSPITAL_COMMUNITY): Payer: Self-pay

## 2023-12-18 ENCOUNTER — Other Ambulatory Visit (HOSPITAL_BASED_OUTPATIENT_CLINIC_OR_DEPARTMENT_OTHER): Payer: Self-pay

## 2023-12-18 MED ORDER — DOXYCYCLINE HYCLATE 100 MG PO CAPS
100.0000 mg | ORAL_CAPSULE | Freq: Two times a day (BID) | ORAL | 0 refills | Status: AC
  Filled 2023-12-18: qty 20, 10d supply, fill #0

## 2023-12-18 MED ORDER — FLUCONAZOLE 150 MG PO TABS
150.0000 mg | ORAL_TABLET | ORAL | 0 refills | Status: DC
  Filled 2023-12-18: qty 2, 3d supply, fill #0

## 2023-12-18 MED ORDER — ALBUTEROL SULFATE HFA 108 (90 BASE) MCG/ACT IN AERS
INHALATION_SPRAY | RESPIRATORY_TRACT | 1 refills | Status: AC
  Filled 2023-12-18: qty 6.7, 20d supply, fill #0

## 2023-12-18 MED ORDER — ESTRADIOL 0.05 MG/24HR TD PTTW
1.0000 | MEDICATED_PATCH | TRANSDERMAL | 2 refills | Status: DC
  Filled 2023-12-18: qty 24, 84d supply, fill #0
  Filled 2024-03-09: qty 24, 84d supply, fill #1
  Filled 2024-05-30: qty 24, 84d supply, fill #2

## 2023-12-18 MED ORDER — BENZONATATE 100 MG PO CAPS
100.0000 mg | ORAL_CAPSULE | Freq: Three times a day (TID) | ORAL | 0 refills | Status: AC
  Filled 2023-12-18: qty 30, 10d supply, fill #0

## 2023-12-18 MED ORDER — PREDNISONE 20 MG PO TABS
40.0000 mg | ORAL_TABLET | Freq: Every day | ORAL | 0 refills | Status: AC
  Filled 2023-12-18: qty 10, 5d supply, fill #0

## 2024-01-02 ENCOUNTER — Other Ambulatory Visit (HOSPITAL_BASED_OUTPATIENT_CLINIC_OR_DEPARTMENT_OTHER): Payer: Self-pay

## 2024-01-02 MED ORDER — AMOXICILLIN-POT CLAVULANATE 875-125 MG PO TABS
1.0000 | ORAL_TABLET | Freq: Two times a day (BID) | ORAL | 0 refills | Status: AC
  Filled 2024-01-02: qty 14, 7d supply, fill #0

## 2024-01-02 MED ORDER — FLUCONAZOLE 150 MG PO TABS
150.0000 mg | ORAL_TABLET | Freq: Once | ORAL | 0 refills | Status: AC
  Filled 2024-01-02: qty 1, 1d supply, fill #0

## 2024-01-04 ENCOUNTER — Other Ambulatory Visit (HOSPITAL_COMMUNITY): Payer: Self-pay

## 2024-01-04 MED ORDER — OMEPRAZOLE 20 MG PO CPDR
20.0000 mg | DELAYED_RELEASE_CAPSULE | Freq: Every day | ORAL | 3 refills | Status: DC
  Filled 2024-01-04 – 2024-01-06 (×2): qty 90, 90d supply, fill #0
  Filled 2024-04-05: qty 90, 90d supply, fill #1
  Filled 2024-06-27: qty 90, 90d supply, fill #2
  Filled 2024-09-27: qty 90, 90d supply, fill #3

## 2024-01-05 ENCOUNTER — Other Ambulatory Visit (HOSPITAL_BASED_OUTPATIENT_CLINIC_OR_DEPARTMENT_OTHER): Payer: Self-pay

## 2024-01-06 ENCOUNTER — Other Ambulatory Visit (HOSPITAL_BASED_OUTPATIENT_CLINIC_OR_DEPARTMENT_OTHER): Payer: Self-pay

## 2024-01-08 ENCOUNTER — Other Ambulatory Visit (HOSPITAL_COMMUNITY): Payer: Self-pay

## 2024-01-13 ENCOUNTER — Other Ambulatory Visit (HOSPITAL_BASED_OUTPATIENT_CLINIC_OR_DEPARTMENT_OTHER): Payer: Self-pay

## 2024-01-13 MED ORDER — ALPRAZOLAM 0.25 MG PO TABS
0.2500 mg | ORAL_TABLET | Freq: Every day | ORAL | 0 refills | Status: DC | PRN
  Filled 2024-01-13: qty 90, 90d supply, fill #0

## 2024-01-16 ENCOUNTER — Other Ambulatory Visit (HOSPITAL_BASED_OUTPATIENT_CLINIC_OR_DEPARTMENT_OTHER): Payer: Self-pay

## 2024-01-21 ENCOUNTER — Other Ambulatory Visit (HOSPITAL_BASED_OUTPATIENT_CLINIC_OR_DEPARTMENT_OTHER): Payer: Self-pay

## 2024-02-02 ENCOUNTER — Other Ambulatory Visit (HOSPITAL_BASED_OUTPATIENT_CLINIC_OR_DEPARTMENT_OTHER): Payer: Self-pay

## 2024-02-03 ENCOUNTER — Other Ambulatory Visit: Payer: Self-pay

## 2024-02-08 ENCOUNTER — Other Ambulatory Visit (HOSPITAL_BASED_OUTPATIENT_CLINIC_OR_DEPARTMENT_OTHER): Payer: Self-pay

## 2024-02-12 ENCOUNTER — Other Ambulatory Visit (INDEPENDENT_AMBULATORY_CARE_PROVIDER_SITE_OTHER): Payer: Self-pay

## 2024-02-12 ENCOUNTER — Other Ambulatory Visit (HOSPITAL_BASED_OUTPATIENT_CLINIC_OR_DEPARTMENT_OTHER): Payer: Self-pay

## 2024-02-12 ENCOUNTER — Encounter: Payer: Self-pay | Admitting: Physical Medicine and Rehabilitation

## 2024-02-12 ENCOUNTER — Ambulatory Visit: Admitting: Physical Medicine and Rehabilitation

## 2024-02-12 DIAGNOSIS — M7918 Myalgia, other site: Secondary | ICD-10-CM

## 2024-02-12 DIAGNOSIS — R1031 Right lower quadrant pain: Secondary | ICD-10-CM

## 2024-02-12 DIAGNOSIS — M5441 Lumbago with sciatica, right side: Secondary | ICD-10-CM

## 2024-02-12 DIAGNOSIS — M5416 Radiculopathy, lumbar region: Secondary | ICD-10-CM

## 2024-02-12 DIAGNOSIS — G8929 Other chronic pain: Secondary | ICD-10-CM

## 2024-02-12 MED ORDER — CYCLOBENZAPRINE HCL 10 MG PO TABS
10.0000 mg | ORAL_TABLET | Freq: Every day | ORAL | 0 refills | Status: AC
  Filled 2024-02-12: qty 30, 30d supply, fill #0

## 2024-02-12 MED ORDER — MELOXICAM 15 MG PO TABS
15.0000 mg | ORAL_TABLET | Freq: Every day | ORAL | 0 refills | Status: AC
  Filled 2024-02-12: qty 30, 30d supply, fill #0

## 2024-02-12 NOTE — Progress Notes (Signed)
 Pain Scale   Average Pain 5

## 2024-02-12 NOTE — Progress Notes (Unsigned)
 Tracy Bishop - 58 y.o. female MRN [id number]  Date of birth: 03/14/1966  Office Visit Note: Visit Date: 02/12/2024 PCP: [name], MD Referred by: [name], MD  Subjective: Chief Complaint  Patient presents with   Lower Back - Pain   HPI: Tracy Bishop is a 58 y.o. female who comes in today as a self referral for evaluation of chronic bilateral lower back pain radiating to right groin, buttock and down thigh, also reports "puffiness" to left posterior knee region. Pain started in January after falling out of chair. States the chair collapsed and she landed on buttocks. Her pain is more intermittent, worsens with prolonged sitting and standing. She describes her pain as tight and pulling sensation, currently rates as 4 out of 10. Some relief of pain with home exercise regimen, heating pad, rest topical pain creams/patches. No history of formal physical therapy. She is an active person, does exercise multiple times a week. She currently works in [employer] se[employer], does travel frequently for work. States she is extremely claustrophobic and is concerned she will not be able to have CT or MRI imaging. Patient denies focal weakness, numbness and tingling. No recent trauma or falls.   She does have history of anxiety and alcohol abuse.      Review of Systems  Musculoskeletal:  Positive for back pain and myalgias.  Neurological:  Negative for tingling, sensory change, focal weakness and weakness.  All other systems reviewed and are negative.  Otherwise per HPI.  Assessment & Plan: Visit Diagnoses:    ICD-10-CM   1. Chronic bilateral low back pain with right-sided sciatica  M54.41 XR Lumbar Spine Complete   G89.29     2. Lumbar radiculopathy  M54.16 XR Lumbar Spine Complete    3. Groin pain, right  R10.31 XR Lumbar Spine Complete    4. Myofascial pain syndrome  M79.18 XR Lumbar Spine Complete       Plan: Findings:  Chronic bilateral lower back pain radiating to right buttock,  groin and down left thigh. Patient continues to have severe pain despite good conservative therapies such as home exercise regimen, rest and use of medications. Patient clinical presentation and exam are complex, differentials include lumbar radiculopathy, facet mediated pain, intrinsic right hip issue and myofascial pain syndrome. I obtained lumbar radiographs in the office today that show rotational curvature, biforaminal narrowing and facet arthropathy to lower lumbar spine. 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 She does not experience pain with internal/external rotation of right hip. Her pain does fit with more an L2 and L3 nerve pattern. Could also be referral from lower lumbar facet joints. We discussed treatment plan in detail today, patient is severely claustrophobic and would like to avoid lumbar MRI imaging at this time. I informed her that I could place referral for open MRI imaging, she would still like to hold as of now. We discussed medication management and I prescribed short course of meloxicam and flexeril. I instructed her to let us know how she is doing and if she changes her mind regarding MRI imaging. I encouraged patient to remain active. No red flag symptoms noted upon exam today.     Meds & Orders:  Meds ordered this encounter  Medications   meloxicam (MOBIC) 15 MG tablet    Sig: Take 1 tablet (15 mg total) by mouth daily.    Dispense:  30 tablet    Refill:  0   cyclobenzaprine (FLEXERIL) 10 MG tablet  Sig: Take 1 tablet (10 mg total) by mouth at bedtime.    Dispense:  30 tablet    Refill:  0    Orders Placed This Encounter  Procedures   XR Lumbar Spine Complete    Follow-up: Return if symptoms worsen or fail to improve.   Procedures: No procedures performed      Clinical History: No specialty comments available.   She reports that she has quit smoking. Her smoking use included cigarettes. She has never used smokeless tobacco. No results for input(s):  "HGBA1C", "LABURIC" in the last 8760 hours.  Objective:  VS:  HT:    WT:   BMI:     BP:   HR: bpm  TEMP: ( )  RESP:  Physical Exam Vitals and nursing note reviewed.  HENT:     Head: Normocephalic and atraumatic.     Right Ear: External ear normal.     Left Ear: External ear normal.     Nose: Nose normal.     Mouth/Throat:     Mouth: Mucous membranes are moist.  Eyes:     Extraocular Movements: Extraocular movements intact.  Cardiovascular:     Rate and Rhythm: Normal rate.     Pulses: Normal pulses.  Pulmonary:     Effort: Pulmonary effort is normal.  Abdominal:     General: Abdomen is flat. There is no distension.  Musculoskeletal:        General: Tenderness present.     Cervical back: Normal range of motion.     Comments: Patient rises from seated position to standing without difficulty. Good lumbar range of motion. No pain noted with facet loading. 5/5 strength noted with bilateral hip flexion, knee flexion/extension, ankle dorsiflexion/plantarflexion and EHL. No clonus noted bilaterally. No pain upon palpation of greater trochanters. No pain with internal/external rotation of bilateral hips. Sensation intact bilaterally. Myofascial tenderness noted to right buttock and right lateral hip region upon palpation. Negative slump test bilaterally. Ambulates without aid, gait steady.     Skin:    General: Skin is warm and dry.     Capillary Refill: Capillary refill takes less than 2 seconds.  Neurological:     General: No focal deficit present.     Mental Status: She is alert and oriented to person, place, and time.  Psychiatric:        Mood and Affect: Mood normal.        Behavior: Behavior normal.     Ortho Exam  Imaging: No results found.   Past Medical/Family/Surgical/Social History: Medications & Allergies reviewed per EMR, new medications updated. 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   Past Medical History:  Diagnosis Date   COVID-19    GERD (gastroesophageal reflux disease)    Hyperlipidemia    Palpitations    Sinusitis    Family History  Problem Relation Age of Onset   Colon cancer Neg Hx    Colon polyps Neg Hx    Esophageal cancer Neg Hx    Stomach cancer Neg Hx    Rectal cancer Neg Hx    Past Surgical History:  Procedure Laterality Date   CESAREAN SECTION     INDUCED ABORTION     Social History   Occupational History   Not on file  Tobacco Use  Smoking status: Former    Types: Cigarettes   Smokeless tobacco: Never  Vaping Use   Vaping status: Some Days  Substance and Sexual Activity   Alcohol use: Yes    Comment: 2-3 times aweek   Drug use: Not Currently   Sexual activity: Not on file

## 2024-03-09 ENCOUNTER — Other Ambulatory Visit (HOSPITAL_BASED_OUTPATIENT_CLINIC_OR_DEPARTMENT_OTHER): Payer: Self-pay

## 2024-03-24 ENCOUNTER — Other Ambulatory Visit (HOSPITAL_BASED_OUTPATIENT_CLINIC_OR_DEPARTMENT_OTHER): Payer: Self-pay

## 2024-04-05 ENCOUNTER — Other Ambulatory Visit (HOSPITAL_BASED_OUTPATIENT_CLINIC_OR_DEPARTMENT_OTHER): Payer: Self-pay

## 2024-04-06 ENCOUNTER — Other Ambulatory Visit (HOSPITAL_BASED_OUTPATIENT_CLINIC_OR_DEPARTMENT_OTHER): Payer: Self-pay

## 2024-04-06 MED ORDER — ALPRAZOLAM 0.25 MG PO TABS
0.2500 mg | ORAL_TABLET | Freq: Every day | ORAL | 0 refills | Status: DC | PRN
Start: 2024-04-05 — End: 2024-08-15
  Filled 2024-04-11: qty 90, 90d supply, fill #0

## 2024-04-06 NOTE — Progress Notes (Deleted)
 Cardiology Clinic Note   Patient Name: Tracy Bishop Date of Encounter: 04/06/2024  Primary Care Provider:  Alfredia Ina, MD Primary Cardiologist:  Arun K Thukkani, MD  Patient Profile    Tracy Bishop 58 year old female presents to the clinic today for follow-up evaluation of her palpitations.  Past Medical History    Past Medical History:  Diagnosis Date   COVID-19    GERD (gastroesophageal reflux disease)    Hyperlipidemia    Palpitations    Sinusitis    Past Surgical History:  Procedure Laterality Date   CESAREAN SECTION     INDUCED ABORTION      Allergies  Allergies  Allergen Reactions   Levofloxacin Hives   Penicillins Rash    History of Present Illness    Tracy Bishop has a PMH of anxiety, GERD, hyperlipidemia, tobacco abuse, alcohol abuse, and palpitations.  She establish care with cardiology 3/23.  She was seen by Dr.Thukkani.  A echocardiogram and coronary calcium  score were recommended.  A cardiac event monitor was also recommended.  Her cardiac event monitor fell off within 24 hours and no results were noted.  Her calcium  score and echocardiogram were not completed.  She was seen in urgent care 07/02/2023 for palpitations.  She reported a flipping type sensation.  Her EKG showed sinus rhythm with PVCs.  She reported increased anxiety.  She reported that she had taken Xanax  before presenting to urgent care.  She was seen in follow-up by Katlyn West NP on 8-24.  During that time she noted intermittent palpitations.  She noted that they could happen multiple times per day.  She described them as a flip-flop type sensation she denied fast heart rates.  She denied shortness of breath and chest pain.  She had no lower extremity swelling.  She reported that she stayed well-hydrated.  She also was drinking about 3 beers 4 times per week and reported that she did vape.  She indicated that she had significant amount of stress related to the passing of her  stepfather and work.  She questions whether azelastine  could have caused her to have increased palpitations she reported over the past month she had URI and had decreased her food intake.  Her elevated LDL was discussed.  She had not been on statin therapy.  She also noted a family history of coronary artery disease with multiple stents hyperlipidemia and atrial fibrillation.  Her blood pressure was noted to be 122/82.  She reported increased palpitations from 7/24 until 7/31.  But noted after that time they had resolved.  Triggers for palpitations were reviewed.  She reported that she would increase her hydration to week cardiac event monitor was also ordered.  She was noted to have an average heart rate of 89, 3 short runs of SVT, and an average heart rate of 89.  Her triggered events were associated with NSR.  She presented to the clinic 10/06/23 for follow-up evaluation and stated she did not do well taking rosuvastatin .  She reduced the medication 2 to 3 days/week.  She reported the she would like to stop the medication.  We reviewed other options for statin therapy.  She expressed understanding.  She is a sleep medicine representative.  We reviewed sleep apnea.  We reviewed high-fiber diet and reducing cholesterol.  She expressed understanding.  She was fairly physically active.  I started ezetimibe , planned repeat fasting lipids and LFTs in February and planned follow-up after lab work.  She presents  to the clinic today for follow-up evaluation and states***.  Today she denies chest pain, shortness of breath, lower extremity edema, fatigue, palpitations, melena, hematuria, hemoptysis, diaphoresis, weakness, presyncope, syncope, orthopnea, and PND.    Home Medications    Prior to Admission medications   Medication Sig Start Date End Date Taking? Authorizing Provider  ALPRAZolam  (XANAX ) 0.25 MG tablet Take 1/2 - 1 tablet by mouth daily as needed for sleep. 03/19/23     amoxicillin -clavulanate  (AUGMENTIN ) 875-125 MG tablet Take 1 tablet by mouth 2 (two) times daily for 10 days 09/05/23     azelastine  (ASTELIN ) 0.1 % nasal spray Place two sprays into both nostrils 2 (two) times daily. 01/08/22     azelastine  (OPTIVAR ) 0.05 % ophthalmic solution Place one drop into both eyes 2 (two) times daily. 01/08/22     benzonatate  (TESSALON ) 100 MG capsule as needed.    [provider]  BIOTIN PO biotin    [provider]  diphenhydrAMINE (SOMINEX) 25 MG tablet Take by mouth.    [provider]  estradiol  (VIVELLE -DOT) 0.05 MG/24HR patch Apply 1 patch topically twice a week. 03/25/22     estradiol  (VIVELLE -DOT) 0.05 MG/24HR patch Place 1 patch (0.05 mg total) onto the skin 2 (two) times a week. 09/28/23     fluconazole  (DIFLUCAN ) 100 MG tablet Take 2 tablets today and then 1 tablet daily to complete a 10 day course 03/18/22     fluconazole  (DIFLUCAN ) 150 MG tablet Take 1 tablet (150 mg) by mouth on day 1, take 1 additional tablet on day 4 if symptoms persist 09/05/23     fluconazole  (DIFLUCAN ) 200 MG tablet Take 1 tablet by mouth now and repeat in 3 days 07/29/23     fluticasone  (FLONASE ) 50 MCG/ACT nasal spray Place 1 spray into both nostrils 2 (two) times daily as needed. 06/08/19   [provider]  guaiFENesin -codeine  100-10 MG/5ML syrup Take 5-10 mLs by mouth 4 (four) times a day as needed for ough for up to 10 days. Max Daily Amount: 40 mLs 06/17/23     ibuprofen  (ADVIL ) 800 MG tablet Take 1 tablet (800 mg total) by mouth every 8 (eight) hours as needed for pain. 04/28/23     Loratadine (CLARITIN PO) daily as needed.    [provider]  Multiple Vitamins-Minerals (ZINC PO) zinc    [provider]  neomycin -polymyxin-dexamethasone  (MAXITROL ) 0.1 % ophthalmic suspension Instill 1 drop into both eyes four times a day as directed 09/16/23     omeprazole  (PRILOSEC) 20 MG capsule Take 1 capsule (20 mg total) by mouth daily. 01/16/23     ondansetron  (ZOFRAN ) 4 MG  tablet Take one tablet by mouth every 8 (eight) hours as needed for Nausea. 02/11/22     ondansetron  (ZOFRAN ) 8 MG tablet Take 1 tablet (8 mg total) by mouth every 8 (eight) hours as needed for Nausea. 07/29/23     progesterone  (PROMETRIUM ) 100 MG capsule Take 1 capsule (100 mg total) by mouth daily. 05/21/23     progesterone  (PROMETRIUM ) 100 MG capsule Take 1 capsule (100 mg total) by mouth daily. 09/28/23     Pyridoxine HCl (B-6 PO) Take by mouth.    [provider]  rosuvastatin  (CRESTOR ) 10 MG tablet Take 1 tablet (10 mg) by mouth daily. 07/20/23 10/18/23  Thukkani, Arun K, MD  SUMAtriptan  (IMITREX ) 50 MG tablet Take one tablet by mouth once as needed for Migraine for up to 1 dose. 02/11/22     tretinoin  (  RETIN-A ) 0.025 % cream 1 application to affected area in the evening to face 08/19/13   [provider]  vitamin B-12 (CYANOCOBALAMIN) 100 MCG tablet Place under the tongue.    [provider]  VITAMIN D PO Vitamin D    [provider]  VITAMIN E PO vitamin E    [provider]  zinc gluconate 50 MG tablet Take by mouth as needed.    [provider]    Family History    Family History  Problem Relation Age of Onset   Colon cancer Neg Hx    Colon polyps Neg Hx    Esophageal cancer Neg Hx    Stomach cancer Neg Hx    Rectal cancer Neg Hx    She indicated that the status of her neg hx is unknown.  Social History    Social History   Socioeconomic History   Marital status: Single    Spouse name: Not on file   Number of children: Not on file   Years of education: Not on file   Highest education level: Not on file  Occupational History   Not on file  Tobacco Use   Smoking status: Former    Types: Cigarettes   Smokeless tobacco: Never  Vaping Use   Vaping status: Some Days  Substance and Sexual Activity   Alcohol use: Yes    Comment: 2-3 times aweek   Drug use: Not Currently   Sexual activity: Not on file  Other Topics Concern    Not on file  Social History Narrative   Not on file   Social Drivers of Health   Financial Resource Strain: Low Risk  (01/16/2023)   Received from [facility]   Overall Financial Resource Strain (CARDIA)    Difficulty of Paying Living Expenses: Not hard at all  Food Insecurity: No Food Insecurity (01/16/2023)   Received from [facility]   Hunger Vital Sign    Worried About Running Out of Food in the Last Year: Never true    Ran Out of Food in the Last Year: Never true  Transportation Needs: No Transportation Needs (01/16/2023)   Received from [facility]    Lack of Transportation (Medical): No    Lack of Transportation (Non-Medical): No  Physical Activity: Sufficiently Active (11/05/2022)   Received from [facility]   Exercise Vital Sign    Days of Exercise per Week: 4 days    Minutes of Exercise per Session: 50 min  Stress: Stress Concern Present (11/05/2022)   Received from [facility] - Occupational Stress Questionnaire    Feeling of Stress : Rather much  Social Connections: Unknown (04/08/2022)   Received from [facility], [facility]   Social Network    Social Network: Not on file  Intimate Partner Violence: Not At Risk (11/05/2022)   Received from [facility], [facility]   Humiliation, Afraid, Rape, and Kick questionnaire    Fear of Current or Ex-Partner: No    Emotionally Abused: No    Physically Abused: No    Sexually Abused: No     Review of Systems    General:  No chills, fever, night sweats or weight changes.  Cardiovascular:  No chest pain, dyspnea on exertion, edema, orthopnea, palpitations, paroxysmal nocturnal dyspnea. Dermatological: No rash, lesions/masses Respiratory: No cough, dyspnea Urologic: No hematuria, dysuria Abdominal:   No nausea, vomiting, diarrhea, bright red  blood per rectum, melena, or hematemesis Neurologic:  No visual  changes, wkns, changes in mental status. All other systems reviewed and are otherwise negative except as noted above.  Physical Exam    VS:  There were no vitals taken for this visit. , BMI There is no height or weight on file to calculate BMI. GEN: Well nourished, well developed, in no acute distress. HEENT: normal. Neck: Supple, no JVD, carotid bruits, or masses. Cardiac: RRR, no murmurs, rubs, or gallops. No clubbing, cyanosis, edema.  Radials/DP/PT 2+ and equal bilaterally.  Respiratory:  Respirations regular and unlabored, clear to auscultation bilaterally. GI: Soft, nontender, nondistended, BS + x 4. MS: no deformity or atrophy. Skin: warm and dry, no rash. Neuro:  Strength and sensation are intact. Psych: Normal affect.  Accessory Clinical Findings    Recent Labs: 07/10/2023: ALT 21; BUN 12; Creatinine, Ser 0.62; Hemoglobin 12.2; Magnesium 2.2; Platelets 314; Potassium 4.9; Sodium 139; TSH 1.730   Recent Lipid Panel    Component Value Date/Time   CHOL 283 (H) 07/14/2023 0924   TRIG 74 07/14/2023 0924   HDL 96 07/14/2023 0924   CHOLHDL 2.9 07/14/2023 0924   LDLCALC 175 (H) 07/14/2023 0924    No BP recorded.  {Refresh Note OR Click here to enter BP  :1}***    ECG personally reviewed by me today-none today.    Cardiac event monitor 09/29/2023   Patch Wear Time:  10 days and 10 hours (2024-10-06T21:53:53-0400 to 2024-10-17T08:46:38-0400)   Patient had a min HR of 60 bpm, max HR of 190 bpm, and avg HR of 89 bpm. Predominant underlying rhythm was Sinus Rhythm.    EVENTS: -3 Supraventricular Tachycardia runs occurred, the run with the fastest interval lasting 7 beats with a max rate of 190 bpm, the longest lasting 7 beats with an avg rate of 140 bpm.    -Isolated SVEs were rare (<1.0%), SVE Couplets were rare (<1.0%), and SVE Triplets were rare (<1.0%).    -Isolated VEs were rare (<1.0%), and no VE Couplets or VE Triplets were present.    No atrial fibrillation,  sustained ventricular tachyarrhythmias, or bradyarrhythmias were detected.   Patient triggered events corresponded with sinus rhythm.     Assessment & Plan   1.  Hyperlipidemia-  07/14/2023: Cholesterol, Total 283; HDL 96; LDL Chol Calc (NIH) 175; Triglycerides 74 Does not like taking statin medication.  Previously had intolerance to rosuvastatin  and was asked to reduce dose.  Coronary calcium  scoring 2.  Done at outside lab. Continue ezetimibe  10 mg daily High-fiber diet Maintain physical activity Repeat fasting lipids and LFTs  Palpitations-heart rate today 8***1bpm.  Prior cardiac event monitor which showed average heart rate of 89, 3 short runs of SVT, and triggered events were associated with sinus rhythm.  Denies increased fatigue.  Notices occasional palpitations*** Maintain p.o. hydration-reviewed Avoid triggers caffeine, chocolate, EtOH, dehydration etc. No plans for further evaluation at this time  Tobacco abuse/vaping-has decreased vaping usage*** Nicotine cessation strongly recommended Smoking cessation information provided  Disposition: Follow-up with Dr.Thukkani or me in 9-12 months.   Chet Cota. Sarafina Puthoff NP-C     04/06/2024, 9:29 AM [facility] [address] Office [phone] Fax [phone]    I spent 14 ***minutes examining this patient, reviewing medications, and using patient centered shared decision making involving her cardiac care.   I spent  20 minutes reviewing  past medical history,  medications, and prior cardiac tests.

## 2024-04-07 ENCOUNTER — Ambulatory Visit: Payer: BC Managed Care – PPO | Admitting: General Practice

## 2024-04-08 ENCOUNTER — Other Ambulatory Visit (HOSPITAL_BASED_OUTPATIENT_CLINIC_OR_DEPARTMENT_OTHER): Payer: Self-pay

## 2024-04-11 ENCOUNTER — Other Ambulatory Visit (HOSPITAL_BASED_OUTPATIENT_CLINIC_OR_DEPARTMENT_OTHER): Payer: Self-pay

## 2024-04-19 ENCOUNTER — Other Ambulatory Visit (HOSPITAL_BASED_OUTPATIENT_CLINIC_OR_DEPARTMENT_OTHER): Payer: Self-pay

## 2024-05-30 ENCOUNTER — Other Ambulatory Visit (HOSPITAL_BASED_OUTPATIENT_CLINIC_OR_DEPARTMENT_OTHER): Payer: Self-pay

## 2024-05-30 MED ORDER — PROGESTERONE MICRONIZED 100 MG PO CAPS
100.0000 mg | ORAL_CAPSULE | Freq: Every day | ORAL | 3 refills | Status: AC
  Filled 2024-05-30: qty 90, 90d supply, fill #0
  Filled 2024-09-13: qty 90, 90d supply, fill #1
  Filled 2024-12-21: qty 90, 90d supply, fill #2

## 2024-05-31 ENCOUNTER — Other Ambulatory Visit (HOSPITAL_BASED_OUTPATIENT_CLINIC_OR_DEPARTMENT_OTHER): Payer: Self-pay

## 2024-06-08 ENCOUNTER — Other Ambulatory Visit (HOSPITAL_BASED_OUTPATIENT_CLINIC_OR_DEPARTMENT_OTHER): Payer: Self-pay

## 2024-07-06 ENCOUNTER — Other Ambulatory Visit (HOSPITAL_BASED_OUTPATIENT_CLINIC_OR_DEPARTMENT_OTHER): Payer: Self-pay

## 2024-07-06 MED ORDER — PHENTERMINE HCL 15 MG PO CAPS
15.0000 mg | ORAL_CAPSULE | Freq: Two times a day (BID) | ORAL | 1 refills | Status: AC
  Filled 2024-07-06: qty 30, 15d supply, fill #0

## 2024-07-06 MED ORDER — PHENTERMINE HCL 15 MG PO CAPS
15.0000 mg | ORAL_CAPSULE | Freq: Every morning | ORAL | 1 refills | Status: DC
  Filled 2024-07-06: qty 30, 30d supply, fill #0

## 2024-07-07 ENCOUNTER — Other Ambulatory Visit (HOSPITAL_BASED_OUTPATIENT_CLINIC_OR_DEPARTMENT_OTHER): Payer: Self-pay

## 2024-07-18 ENCOUNTER — Other Ambulatory Visit (HOSPITAL_BASED_OUTPATIENT_CLINIC_OR_DEPARTMENT_OTHER): Payer: Self-pay

## 2024-08-15 ENCOUNTER — Other Ambulatory Visit: Payer: Self-pay

## 2024-08-15 ENCOUNTER — Other Ambulatory Visit (HOSPITAL_BASED_OUTPATIENT_CLINIC_OR_DEPARTMENT_OTHER): Payer: Self-pay

## 2024-08-15 ENCOUNTER — Other Ambulatory Visit: Payer: Self-pay | Admitting: General Practice

## 2024-08-15 MED ORDER — ALPRAZOLAM 0.25 MG PO TABS
0.2500 mg | ORAL_TABLET | Freq: Every day | ORAL | 0 refills | Status: DC | PRN
  Filled 2024-08-15: qty 90, 90d supply, fill #0

## 2024-08-15 MED ORDER — LEVOCETIRIZINE DIHYDROCHLORIDE 5 MG PO TABS
5.0000 mg | ORAL_TABLET | Freq: Every evening | ORAL | 5 refills | Status: AC
  Filled 2024-08-15: qty 30, 30d supply, fill #0
  Filled 2024-09-19: qty 30, 30d supply, fill #1
  Filled 2024-10-19: qty 30, 30d supply, fill #2
  Filled 2024-11-16: qty 30, 30d supply, fill #3
  Filled 2024-12-21: qty 30, 30d supply, fill #4

## 2024-08-15 MED ORDER — EZETIMIBE 10 MG PO TABS
10.0000 mg | ORAL_TABLET | Freq: Every day | ORAL | 0 refills | Status: DC
  Filled 2024-08-15: qty 30, 30d supply, fill #0

## 2024-08-16 ENCOUNTER — Other Ambulatory Visit: Payer: Self-pay

## 2024-08-19 ENCOUNTER — Other Ambulatory Visit (HOSPITAL_BASED_OUTPATIENT_CLINIC_OR_DEPARTMENT_OTHER): Payer: Self-pay

## 2024-08-23 ENCOUNTER — Other Ambulatory Visit (HOSPITAL_BASED_OUTPATIENT_CLINIC_OR_DEPARTMENT_OTHER): Payer: Self-pay

## 2024-08-23 MED ORDER — ESTRADIOL 0.05 MG/24HR TD PTTW
1.0000 | MEDICATED_PATCH | TRANSDERMAL | 0 refills | Status: AC
  Filled 2024-08-23: qty 24, 84d supply, fill #0

## 2024-09-09 ENCOUNTER — Other Ambulatory Visit (HOSPITAL_BASED_OUTPATIENT_CLINIC_OR_DEPARTMENT_OTHER): Payer: Self-pay

## 2024-09-09 MED ORDER — ESTRADIOL 0.025 MG/24HR TD PTTW
MEDICATED_PATCH | TRANSDERMAL | 3 refills | Status: AC
  Filled 2024-09-09: qty 24, 84d supply, fill #0

## 2024-09-19 ENCOUNTER — Other Ambulatory Visit (HOSPITAL_BASED_OUTPATIENT_CLINIC_OR_DEPARTMENT_OTHER): Payer: Self-pay

## 2024-09-19 ENCOUNTER — Other Ambulatory Visit: Payer: Self-pay | Admitting: Internal Medicine

## 2024-09-19 ENCOUNTER — Other Ambulatory Visit: Payer: Self-pay

## 2024-09-23 ENCOUNTER — Other Ambulatory Visit (HOSPITAL_BASED_OUTPATIENT_CLINIC_OR_DEPARTMENT_OTHER): Payer: Self-pay

## 2024-09-26 ENCOUNTER — Other Ambulatory Visit (HOSPITAL_BASED_OUTPATIENT_CLINIC_OR_DEPARTMENT_OTHER): Payer: Self-pay

## 2024-09-26 MED ORDER — DOXYCYCLINE HYCLATE 100 MG PO CAPS
100.0000 mg | ORAL_CAPSULE | Freq: Two times a day (BID) | ORAL | 0 refills | Status: AC
  Filled 2024-09-26: qty 20, 10d supply, fill #0

## 2024-09-26 MED ORDER — EZETIMIBE 10 MG PO TABS
10.0000 mg | ORAL_TABLET | Freq: Every day | ORAL | 1 refills | Status: AC
  Filled 2024-09-26: qty 90, 90d supply, fill #0
  Filled 2024-12-27: qty 90, 90d supply, fill #1

## 2024-09-26 MED ORDER — FLUCONAZOLE 150 MG PO TABS
ORAL_TABLET | ORAL | 0 refills | Status: AC
Start: 2024-09-26 — End: ?
  Filled 2024-09-26: qty 2, 2d supply, fill #0

## 2024-09-26 MED ORDER — ONDANSETRON HCL 4 MG PO TABS
4.0000 mg | ORAL_TABLET | Freq: Three times a day (TID) | ORAL | 0 refills | Status: AC | PRN
  Filled 2024-09-26: qty 30, 10d supply, fill #0

## 2024-10-10 ENCOUNTER — Encounter: Payer: Self-pay | Admitting: Radiology

## 2024-10-19 ENCOUNTER — Other Ambulatory Visit (HOSPITAL_BASED_OUTPATIENT_CLINIC_OR_DEPARTMENT_OTHER): Payer: Self-pay

## 2024-10-26 ENCOUNTER — Other Ambulatory Visit (HOSPITAL_BASED_OUTPATIENT_CLINIC_OR_DEPARTMENT_OTHER): Payer: Self-pay

## 2024-10-26 MED ORDER — CEPHALEXIN 500 MG PO CAPS
500.0000 mg | ORAL_CAPSULE | Freq: Two times a day (BID) | ORAL | 0 refills | Status: AC
  Filled 2024-10-26: qty 10, 5d supply, fill #0

## 2024-11-01 ENCOUNTER — Other Ambulatory Visit (HOSPITAL_BASED_OUTPATIENT_CLINIC_OR_DEPARTMENT_OTHER): Payer: Self-pay

## 2024-11-02 ENCOUNTER — Other Ambulatory Visit (HOSPITAL_BASED_OUTPATIENT_CLINIC_OR_DEPARTMENT_OTHER): Payer: Self-pay

## 2024-11-02 MED ORDER — NEOMYCIN-POLYMYXIN-DEXAMETH 3.5-10000-0.1 OP SUSP
OPHTHALMIC | 0 refills | Status: AC
  Filled 2024-11-02: qty 5, 7d supply, fill #0

## 2024-11-16 ENCOUNTER — Other Ambulatory Visit (HOSPITAL_BASED_OUTPATIENT_CLINIC_OR_DEPARTMENT_OTHER): Payer: Self-pay

## 2024-12-21 ENCOUNTER — Other Ambulatory Visit (HOSPITAL_BASED_OUTPATIENT_CLINIC_OR_DEPARTMENT_OTHER): Payer: Self-pay

## 2024-12-27 ENCOUNTER — Other Ambulatory Visit (HOSPITAL_BASED_OUTPATIENT_CLINIC_OR_DEPARTMENT_OTHER): Payer: Self-pay

## 2024-12-30 ENCOUNTER — Other Ambulatory Visit (HOSPITAL_BASED_OUTPATIENT_CLINIC_OR_DEPARTMENT_OTHER): Payer: Self-pay

## 2024-12-30 MED ORDER — OMEPRAZOLE 20 MG PO CPDR
20.0000 mg | DELAYED_RELEASE_CAPSULE | Freq: Every day | ORAL | 3 refills | Status: AC
  Filled 2024-12-30: qty 90, 90d supply, fill #0

## 2025-01-13 ENCOUNTER — Other Ambulatory Visit (HOSPITAL_BASED_OUTPATIENT_CLINIC_OR_DEPARTMENT_OTHER): Payer: Self-pay

## 2025-01-13 MED ORDER — ALPRAZOLAM 0.25 MG PO TABS
0.2500 mg | ORAL_TABLET | Freq: Every day | ORAL | 0 refills | Status: AC | PRN
  Filled 2025-01-13: qty 90, 90d supply, fill #0
